# Patient Record
Sex: Female | Born: 1970 | Race: White | Hispanic: No | Marital: Married | Smoking: Never smoker
Health system: Southern US, Community
[De-identification: ages and names within clinical notes are randomized; demographics above are authoritative.]

## PROBLEM LIST (undated history)

## (undated) DIAGNOSIS — I10 Essential (primary) hypertension: Secondary | ICD-10-CM

## (undated) DIAGNOSIS — F3281 Premenstrual dysphoric disorder: Secondary | ICD-10-CM

## (undated) DIAGNOSIS — K219 Gastro-esophageal reflux disease without esophagitis: Secondary | ICD-10-CM

## (undated) DIAGNOSIS — D249 Benign neoplasm of unspecified breast: Secondary | ICD-10-CM

## (undated) DIAGNOSIS — G43909 Migraine, unspecified, not intractable, without status migrainosus: Secondary | ICD-10-CM

## (undated) DIAGNOSIS — K5792 Diverticulitis of intestine, part unspecified, without perforation or abscess without bleeding: Secondary | ICD-10-CM

## (undated) DIAGNOSIS — T7840XA Allergy, unspecified, initial encounter: Secondary | ICD-10-CM

## (undated) HISTORY — PX: COLONOSCOPY: SHX174

## (undated) HISTORY — PX: BREAST EXCISIONAL BIOPSY: SUR124

## (undated) HISTORY — PX: TONSILLECTOMY: SHX5217

## (undated) HISTORY — DX: Migraine, unspecified, not intractable, without status migrainosus: G43.909

## (undated) HISTORY — DX: Essential (primary) hypertension: I10

## (undated) HISTORY — DX: Diverticulitis of intestine, part unspecified, without perforation or abscess without bleeding: K57.92

## (undated) HISTORY — DX: Gastro-esophageal reflux disease without esophagitis: K21.9

## (undated) HISTORY — DX: Premenstrual dysphoric disorder: F32.81

## (undated) HISTORY — PX: CERVICAL FUSION: SHX112

## (undated) HISTORY — DX: Benign neoplasm of unspecified breast: D24.9

## (undated) HISTORY — DX: Allergy, unspecified, initial encounter: T78.40XA

---

## 1994-08-26 HISTORY — PX: OTHER SURGICAL HISTORY: SHX169

## 2002-10-01 ENCOUNTER — Other Ambulatory Visit: Admission: RE | Admit: 2002-10-01 | Discharge: 2002-10-01 | Payer: Self-pay | Admitting: Internal Medicine

## 2003-03-01 ENCOUNTER — Encounter: Payer: Self-pay | Admitting: Family Medicine

## 2003-03-01 ENCOUNTER — Ambulatory Visit (HOSPITAL_COMMUNITY): Admission: RE | Admit: 2003-03-01 | Discharge: 2003-03-01 | Payer: Self-pay | Admitting: Family Medicine

## 2003-03-15 ENCOUNTER — Encounter: Payer: Self-pay | Admitting: Internal Medicine

## 2003-03-15 ENCOUNTER — Encounter: Admission: RE | Admit: 2003-03-15 | Discharge: 2003-03-15 | Payer: Self-pay | Admitting: Family Medicine

## 2003-03-15 ENCOUNTER — Encounter: Payer: Self-pay | Admitting: Family Medicine

## 2003-03-31 ENCOUNTER — Encounter: Admission: RE | Admit: 2003-03-31 | Discharge: 2003-03-31 | Payer: Self-pay | Admitting: Internal Medicine

## 2003-03-31 ENCOUNTER — Encounter: Payer: Self-pay | Admitting: Unknown Physician Specialty

## 2003-12-21 ENCOUNTER — Other Ambulatory Visit: Admission: RE | Admit: 2003-12-21 | Discharge: 2003-12-21 | Payer: Self-pay | Admitting: Internal Medicine

## 2003-12-26 ENCOUNTER — Encounter: Admission: RE | Admit: 2003-12-26 | Discharge: 2003-12-26 | Payer: Self-pay | Admitting: Internal Medicine

## 2004-08-07 ENCOUNTER — Ambulatory Visit: Payer: Self-pay | Admitting: General Surgery

## 2004-11-26 ENCOUNTER — Ambulatory Visit: Payer: Self-pay | Admitting: Internal Medicine

## 2005-01-28 ENCOUNTER — Other Ambulatory Visit: Admission: RE | Admit: 2005-01-28 | Discharge: 2005-01-28 | Payer: Self-pay | Admitting: Internal Medicine

## 2005-01-28 ENCOUNTER — Ambulatory Visit: Payer: Self-pay | Admitting: Internal Medicine

## 2005-10-24 HISTORY — PX: BACK SURGERY: SHX140

## 2006-03-10 ENCOUNTER — Ambulatory Visit: Payer: Self-pay | Admitting: Family Medicine

## 2006-08-27 ENCOUNTER — Encounter: Admission: RE | Admit: 2006-08-27 | Discharge: 2006-08-27 | Payer: Self-pay | Admitting: General Surgery

## 2006-09-03 ENCOUNTER — Encounter: Admission: RE | Admit: 2006-09-03 | Discharge: 2006-09-03 | Payer: Self-pay | Admitting: General Surgery

## 2006-09-24 ENCOUNTER — Ambulatory Visit: Payer: Self-pay | Admitting: Internal Medicine

## 2006-10-07 ENCOUNTER — Encounter: Payer: Self-pay | Admitting: Internal Medicine

## 2006-12-17 ENCOUNTER — Encounter: Payer: Self-pay | Admitting: Internal Medicine

## 2006-12-17 ENCOUNTER — Other Ambulatory Visit: Admission: RE | Admit: 2006-12-17 | Discharge: 2006-12-17 | Payer: Self-pay | Admitting: Internal Medicine

## 2006-12-17 ENCOUNTER — Ambulatory Visit: Payer: Self-pay | Admitting: Internal Medicine

## 2006-12-17 DIAGNOSIS — R5381 Other malaise: Secondary | ICD-10-CM

## 2006-12-17 DIAGNOSIS — K644 Residual hemorrhoidal skin tags: Secondary | ICD-10-CM | POA: Insufficient documentation

## 2006-12-17 DIAGNOSIS — R5383 Other fatigue: Secondary | ICD-10-CM

## 2006-12-17 DIAGNOSIS — F39 Unspecified mood [affective] disorder: Secondary | ICD-10-CM | POA: Insufficient documentation

## 2006-12-17 DIAGNOSIS — K5730 Diverticulosis of large intestine without perforation or abscess without bleeding: Secondary | ICD-10-CM | POA: Insufficient documentation

## 2006-12-17 LAB — CONVERTED CEMR LAB
BUN: 14 mg/dL (ref 6–23)
Basophils Absolute: 0 10*3/uL (ref 0.0–0.1)
Basophils Relative: 0.3 % (ref 0.0–1.0)
Eosinophils Absolute: 0.1 10*3/uL (ref 0.0–0.6)
Eosinophils Relative: 0.9 % (ref 0.0–5.0)
GFR calc Af Amer: 104 mL/min
HCT: 39.4 % (ref 36.0–46.0)
Hemoglobin: 13.6 g/dL (ref 12.0–15.0)
Monocytes Absolute: 0.6 10*3/uL (ref 0.2–0.7)
Neutro Abs: 4.4 10*3/uL (ref 1.4–7.7)
Neutrophils Relative %: 65.6 % (ref 43.0–77.0)
RDW: 12.7 % (ref 11.5–14.6)
WBC: 6.7 10*3/uL (ref 4.5–10.5)

## 2007-03-16 ENCOUNTER — Encounter: Payer: Self-pay | Admitting: Internal Medicine

## 2007-08-31 ENCOUNTER — Encounter: Admission: RE | Admit: 2007-08-31 | Discharge: 2007-08-31 | Payer: Self-pay | Admitting: General Surgery

## 2007-09-29 ENCOUNTER — Encounter: Payer: Self-pay | Admitting: Internal Medicine

## 2007-12-09 ENCOUNTER — Encounter: Payer: Self-pay | Admitting: Internal Medicine

## 2007-12-31 ENCOUNTER — Telehealth (INDEPENDENT_AMBULATORY_CARE_PROVIDER_SITE_OTHER): Payer: Self-pay | Admitting: *Deleted

## 2008-01-13 ENCOUNTER — Ambulatory Visit: Payer: Self-pay | Admitting: Internal Medicine

## 2008-01-13 ENCOUNTER — Telehealth (INDEPENDENT_AMBULATORY_CARE_PROVIDER_SITE_OTHER): Payer: Self-pay | Admitting: *Deleted

## 2008-01-13 DIAGNOSIS — B079 Viral wart, unspecified: Secondary | ICD-10-CM | POA: Insufficient documentation

## 2008-01-28 ENCOUNTER — Ambulatory Visit: Payer: Self-pay | Admitting: Internal Medicine

## 2008-01-28 DIAGNOSIS — M545 Low back pain: Secondary | ICD-10-CM

## 2008-03-16 ENCOUNTER — Encounter: Admission: RE | Admit: 2008-03-16 | Discharge: 2008-03-16 | Payer: Self-pay | Admitting: Internal Medicine

## 2008-03-17 ENCOUNTER — Telehealth: Payer: Self-pay | Admitting: Internal Medicine

## 2008-04-11 ENCOUNTER — Encounter: Payer: Self-pay | Admitting: Internal Medicine

## 2008-04-21 ENCOUNTER — Ambulatory Visit: Payer: Self-pay | Admitting: Internal Medicine

## 2008-04-21 DIAGNOSIS — J309 Allergic rhinitis, unspecified: Secondary | ICD-10-CM | POA: Insufficient documentation

## 2008-08-20 ENCOUNTER — Encounter: Admission: RE | Admit: 2008-08-20 | Discharge: 2008-08-20 | Payer: Self-pay | Admitting: Obstetrics and Gynecology

## 2008-09-01 ENCOUNTER — Telehealth: Payer: Self-pay | Admitting: Family Medicine

## 2008-09-15 ENCOUNTER — Encounter: Payer: Self-pay | Admitting: Physical Medicine and Rehabilitation

## 2008-09-26 ENCOUNTER — Encounter: Payer: Self-pay | Admitting: Physical Medicine and Rehabilitation

## 2008-10-11 ENCOUNTER — Encounter: Payer: Self-pay | Admitting: Internal Medicine

## 2008-11-02 ENCOUNTER — Encounter: Admission: RE | Admit: 2008-11-02 | Discharge: 2008-11-02 | Payer: Self-pay | Admitting: General Surgery

## 2008-11-11 ENCOUNTER — Encounter: Admission: RE | Admit: 2008-11-11 | Discharge: 2008-11-11 | Payer: Self-pay | Admitting: General Surgery

## 2008-11-16 ENCOUNTER — Telehealth: Payer: Self-pay | Admitting: Internal Medicine

## 2008-12-05 ENCOUNTER — Ambulatory Visit: Payer: Self-pay | Admitting: Internal Medicine

## 2008-12-05 ENCOUNTER — Telehealth (INDEPENDENT_AMBULATORY_CARE_PROVIDER_SITE_OTHER): Payer: Self-pay | Admitting: *Deleted

## 2008-12-05 DIAGNOSIS — F329 Major depressive disorder, single episode, unspecified: Secondary | ICD-10-CM | POA: Insufficient documentation

## 2008-12-29 ENCOUNTER — Ambulatory Visit: Payer: Self-pay | Admitting: Internal Medicine

## 2009-01-24 ENCOUNTER — Ambulatory Visit: Payer: Self-pay | Admitting: Family Medicine

## 2009-01-24 DIAGNOSIS — S139XXA Sprain of joints and ligaments of unspecified parts of neck, initial encounter: Secondary | ICD-10-CM

## 2009-01-25 ENCOUNTER — Encounter: Payer: Self-pay | Admitting: Family Medicine

## 2009-01-25 ENCOUNTER — Telehealth (INDEPENDENT_AMBULATORY_CARE_PROVIDER_SITE_OTHER): Payer: Self-pay | Admitting: Internal Medicine

## 2009-01-26 ENCOUNTER — Encounter: Payer: Self-pay | Admitting: Internal Medicine

## 2009-01-30 ENCOUNTER — Telehealth: Payer: Self-pay | Admitting: Internal Medicine

## 2009-02-01 ENCOUNTER — Telehealth: Payer: Self-pay | Admitting: Internal Medicine

## 2009-02-01 DIAGNOSIS — M5412 Radiculopathy, cervical region: Secondary | ICD-10-CM | POA: Insufficient documentation

## 2009-02-04 ENCOUNTER — Encounter: Admission: RE | Admit: 2009-02-04 | Discharge: 2009-02-04 | Payer: Self-pay | Admitting: Internal Medicine

## 2009-02-09 ENCOUNTER — Telehealth: Payer: Self-pay | Admitting: Internal Medicine

## 2009-02-23 ENCOUNTER — Inpatient Hospital Stay (HOSPITAL_COMMUNITY): Admission: RE | Admit: 2009-02-23 | Discharge: 2009-02-24 | Payer: Self-pay | Admitting: Neurosurgery

## 2009-02-23 ENCOUNTER — Encounter: Payer: Self-pay | Admitting: Family Medicine

## 2009-03-06 ENCOUNTER — Telehealth: Payer: Self-pay | Admitting: Internal Medicine

## 2009-04-24 ENCOUNTER — Ambulatory Visit: Payer: Self-pay | Admitting: Internal Medicine

## 2009-04-24 ENCOUNTER — Other Ambulatory Visit: Admission: RE | Admit: 2009-04-24 | Discharge: 2009-04-24 | Payer: Self-pay | Admitting: Internal Medicine

## 2009-04-24 ENCOUNTER — Encounter: Payer: Self-pay | Admitting: Internal Medicine

## 2009-04-25 ENCOUNTER — Encounter: Payer: Self-pay | Admitting: Internal Medicine

## 2009-07-09 ENCOUNTER — Inpatient Hospital Stay (HOSPITAL_COMMUNITY): Admission: RE | Admit: 2009-07-09 | Discharge: 2009-07-11 | Payer: Self-pay | Admitting: Psychiatry

## 2009-07-10 ENCOUNTER — Ambulatory Visit: Payer: Self-pay | Admitting: Psychiatry

## 2009-10-20 ENCOUNTER — Telehealth: Payer: Self-pay | Admitting: Internal Medicine

## 2009-10-24 ENCOUNTER — Ambulatory Visit: Payer: Self-pay | Admitting: Internal Medicine

## 2009-11-03 ENCOUNTER — Encounter: Admission: RE | Admit: 2009-11-03 | Discharge: 2009-11-03 | Payer: Self-pay | Admitting: General Surgery

## 2009-11-14 ENCOUNTER — Telehealth (INDEPENDENT_AMBULATORY_CARE_PROVIDER_SITE_OTHER): Payer: Self-pay | Admitting: *Deleted

## 2009-12-12 ENCOUNTER — Encounter: Payer: Self-pay | Admitting: Internal Medicine

## 2009-12-12 LAB — HM MAMMOGRAPHY

## 2009-12-18 ENCOUNTER — Encounter: Payer: Self-pay | Admitting: Internal Medicine

## 2010-02-08 ENCOUNTER — Telehealth: Payer: Self-pay | Admitting: Internal Medicine

## 2010-05-01 ENCOUNTER — Ambulatory Visit: Payer: Self-pay | Admitting: Internal Medicine

## 2010-05-01 ENCOUNTER — Other Ambulatory Visit: Admission: RE | Admit: 2010-05-01 | Discharge: 2010-05-01 | Payer: Self-pay | Admitting: Internal Medicine

## 2010-05-01 LAB — HM PAP SMEAR

## 2010-05-02 LAB — CONVERTED CEMR LAB
BUN: 14 mg/dL (ref 6–23)
Basophils Relative: 0.3 % (ref 0.0–3.0)
Bilirubin, Direct: 0.1 mg/dL (ref 0.0–0.3)
CO2: 29 meq/L (ref 19–32)
Calcium: 8.6 mg/dL (ref 8.4–10.5)
Creatinine, Ser: 0.7 mg/dL (ref 0.4–1.2)
GFR calc non Af Amer: 96.39 mL/min (ref >60)
Lymphocytes Relative: 22.5 % (ref 12.0–46.0)
MCV: 85.4 fL (ref 78.0–100.0)
Monocytes Absolute: 0.5 10*3/uL (ref 0.1–1.0)
Monocytes Relative: 8 % (ref 3.0–12.0)
Neutro Abs: 3.9 10*3/uL (ref 1.4–7.7)
Neutrophils Relative %: 65.4 % (ref 43.0–77.0)
Platelets: 284 10*3/uL (ref 150.0–400.0)
Potassium: 4.2 meq/L (ref 3.5–5.1)
RBC: 4.16 M/uL (ref 3.87–5.11)
RDW: 13.7 % (ref 11.5–14.6)
Sodium: 140 meq/L (ref 135–145)

## 2010-05-24 ENCOUNTER — Telehealth: Payer: Self-pay | Admitting: Internal Medicine

## 2010-06-25 ENCOUNTER — Telehealth: Payer: Self-pay | Admitting: Internal Medicine

## 2010-07-02 ENCOUNTER — Ambulatory Visit: Payer: Self-pay | Admitting: Internal Medicine

## 2010-09-06 ENCOUNTER — Ambulatory Visit
Admission: RE | Admit: 2010-09-06 | Discharge: 2010-09-06 | Payer: Self-pay | Source: Home / Self Care | Attending: Internal Medicine | Admitting: Internal Medicine

## 2010-09-25 NOTE — Progress Notes (Signed)
Summary: SERTRALINE  Phone Note Refill Request Message from:  [phone] on October 20, 2009 8:06 AM  Refills Requested: Medication #1:  SERTRALINE HCL 100 MG TABS 1 daily as directed   Last Refilled: 09/17/2009 E-Scribe Request    Method Requested: Telephone to Pharmacy Initial call taken by: Mervin Hack CMA Duncan Dull),  October 20, 2009 8:07 AM  Follow-up for Phone Call        okay to refill for 1 year Follow-up by: Cindee Salt MD,  October 20, 2009 8:13 AM  Additional Follow-up for Phone Call Additional follow up Details #1::        Rx faxed to pharmacy Additional Follow-up by: DeShannon Smith CMA Duncan Dull),  October 20, 2009 8:47 AM    Prescriptions: SERTRALINE HCL 100 MG TABS (SERTRALINE HCL) 1 daily as directed  #30 Each x 12   Entered by:   Mervin Hack CMA (AAMA)   Authorized by:   Cindee Salt MD   Signed by:   Mervin Hack CMA (AAMA) on 10/20/2009   Method used:   Electronically to        [facility] [address]       [address]       [place], [place]  [address]       Ph: [phone]       Fax: [phone]   RxID:   [id number]

## 2010-09-25 NOTE — Progress Notes (Signed)
Summary: requests xanax  Phone Note Call from Patient Call back at [phone]   Caller: Patient Call For: Cindee Salt MD Summary of Call: Pt says she is having a problem with anxiety, she says that you are aware that she has panic attacks.  She is asking if she can have just a few xanax called to [facility] [address], suggested by her psychologist. Initial call taken by: Lowella Petties CMA, AAMA,  June 25, 2010 4:49 PM  Follow-up for Phone Call        okay alprazolam 0.25 #20 x 0 1 two times a day as needed for severe anxiety Follow-up by: Cindee Salt MD,  June 25, 2010 5:41 PM  Additional Follow-up for Phone Call Additional follow up Details #1::        Rx Called In, Called patient and left message on machine with results Additional Follow-up by: Mervin Hack CMA (AAMA),  June 26, 2010 9:00 AM    New/Updated Medications: ALPRAZOLAM 0.25 MG TABS (ALPRAZOLAM) 1 two times a day as needed for severe anxiety Prescriptions: ALPRAZOLAM 0.25 MG TABS (ALPRAZOLAM) 1 two times a day as needed for severe anxiety  #20 x 0   Entered by:   Mervin Hack CMA (AAMA)   Authorized by:   Cindee Salt MD   Signed by:   Mervin Hack CMA (AAMA) on 06/26/2010   Method used:   Telephoned to ...       [facility] [address]ail[facility]       [address], [address]       [place], [place]  [address]       Ph: [phone]       Fax: [phone]   RxID:   [id number]

## 2010-09-25 NOTE — Progress Notes (Signed)
Summary: ?? concerning how she feels  Phone Note Call from Patient Call back at Work Phone [phone] Call back at [phone]   Caller: Patient Call For: Tracy Salt MD Summary of Call: Patient has been having a really fast heart rate over the last week. She has been feeling dizzy, having palpitations, nauseated and feeling very fatigued. This morning her BP was a little elevated at 140/86.  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 said it was okay, but that she should let you know what has been going on. She wants to know if she need to come in and be seen or if she should just monitor this.Please advise.  Initial call taken by: Melody Comas,  February 08, 2010 10:27 AM  Follow-up for Phone Call        please make sure she isn't having a lot of caffeine or taking any cold meds  If symptoms persist, she should make appt for evaluation Follow-up by: Tracy Salt MD,  February 08, 2010 12:46 PM  Additional Follow-up for Phone Call Additional follow up Details #1::        Patient says that she has not been having alot of caffeine or cold med. She will call back and make appt. if symptoms persist.  Additional Follow-up by: Melody Comas,  February 08, 2010 1:15 PM

## 2010-09-25 NOTE — Letter (Signed)
Summary: [facility]  [facility]   Imported By: Maryln Gottron 12/21/2009 14:56:41  _____________________________________________________________________  External Attachment:    Type:   Image     Comment:   External Document

## 2010-09-25 NOTE — Progress Notes (Signed)
Summary: Diag Code  Phone Note Call from Patient Call back at [phone]   Caller: Patient Call For: Cindee Salt MD Summary of Call: pt was in for office visit on 10/24/2009 and pt states she would like the diag. codes changed from depression to something else, less cheaper. Pt states her insurance will not pay for diag. of depression, she knows this can be done. Please advise. Initial call taken by: Mervin Hack CMA Duncan Dull),  November 14, 2009 10:12 AM  Follow-up for Phone Call        Please let her know that that is the only diagnosis code used at that visit so I cannot change it. I am sorry about the increased charge and we could bill it under a different code if there were any others documented. That should not be an issue for her physical Follow-up by: Cindee Salt MD,  November 14, 2009 10:18 AM    Additional Follow-up for Phone Call Additional follow up Details #2::    I explained coding and billing is based on documentation and that the documentation supports the billing.  I also explained that we are not able to change documentation/billing based on patient or insurance requests and that documentation had been reviewed and supports coding/billing.    Patient stated that she may not be able to come to the office visit for follow-up for this particular medication due to the cost of the visit.  She stated that she was only coming in to be monitored for the medicine and that she might not be able to come in as often or at all at this cost.    I explained that I would notify nurse/doctor and if there were any clinical information re: follow up with the medicine that they would be in contact.     Follow-up by: Clarisa Schools,  December 01, 2009 9:58 AM   Appended Document: Diag Code let her know that I will see her next for the physical which is covered--then discuss how we can manage this in the future

## 2010-09-25 NOTE — Assessment & Plan Note (Signed)
Summary: CPX /W/PAP/RBH   Vital Signs:  Patient profile:   40 year old female Weight:      155 pounds Temp:     98.5 degrees F oral Pulse rate:   68 / minute Pulse rhythm:   regular BP sitting:   128 / 80  (left arm) Cuff size:   regular  Vitals Entered By: Mervin Hack CMA Duncan Dull) (May 01, 2010 8:39 AM) CC: ADULT PHYSICAL   History of Present Illness: Had spell of palpitations and increased  BP in June Settled down quickly no recurrence  Has noticed that she is getting stangled a lot-even on saliva never chokes on food--just liquid No heartburn No voice changes  Bulging disc in low back is worse--gets sciatica at times Neck is fine  trouble with hands going numb at night has to keep arm on a pillow for arm she is sleeping on both are affecting Occ gets daytime tingling when gripping handlebars hard in spin class  Mood has been better still married but not back to normal still No infidelity that she is aware of  Allergies: 1)  Codeine Sulfate (Codeine Sulfate)  Past History:  Past medical, surgical, family and social histories (including risk factors) reviewed for relevance to current acute and chronic problems.  Past Medical History: Reviewed history from 04/21/2008 and no changes required. Diverticulosis, colon Migraines (with periods) Premenstrual dysphoric disorder Allergic rhinitis  Past Surgical History: Reviewed history from 04/24/2009 and no changes required. 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 Eyes:  Denies double vision and vision loss-1 eye. ENT:  Denies decreased hearing and ringing in ears; teeth okay--regular with dentist. CV:  Complains of lightheadness; denies chest pain or discomfort, difficulty breathing at night, difficulty breathing while lying down, fainting, palpitations, and shortness of breath with exertion; occ dizziness--may be related to missing meals. Resp:  Denies cough and shortness of breath. GI:  Denies abdominal pain, bloody stools, change in bowel habits, dark tarry stools, indigestion, nausea, and vomiting. GU:  Denies dysuria, incontinence, and urinary frequency; occ bladder pain if she gets urgency--spasms no sexual problems--he doesn't have interest. MS:  Complains of low back pain; denies joint pain and joint swelling. Derm:  Denies lesion(s) and rash. Neuro:  Complains of headaches, numbness, and tingling; denies tremors and weakness; migraines still--able to abort. Psych:  Complains of anxiety; occ anxiety spells--not bad occ down times but nothing persistent. Heme:  Denies abnormal bruising and enlarge lymph nodes. Allergy:  Complains of seasonal allergies and sneezing; uses OTC meds.  Physical Exam  General:  alert and normal appearance.   Eyes:  pupils equal, pupils round, pupils reactive to light, and no optic disk abnormalities.   Ears:  R ear normal and L ear normal.   Mouth:  no erythema, no exudates, and no lesions.   Palate moves normally Neck:  supple, no masses, no thyromegaly, no carotid bruits,  and no cervical lymphadenopathy.   Lungs:  normal respiratory effort, no intercostal retractions, no accessory muscle use, and normal breath sounds.   Heart:  normal rate, regular rhythm, no murmur, no gallop, and no rub.   Abdomen:  soft, non-tender, and no  masses.   Genitalia:  no external lesions, no vaginal or cervical lesions, no friaility or hemorrhage, normal uterus size and position, and no adnexal masses or tenderness.   Msk:  no joint tenderness and no joint swelling.   Pulses:  1+ in feet Extremities:  no edema Neurologic:  alert & oriented X3, strength normal in all extremities, and gait normal.   Skin:  no rashes and no suspicious lesions.   Multiple benign nevi Axillary Nodes:  No palpable lymphadenopathy Psych:  normally interactive, good eye contact, not anxious appearing, and not depressed appearing.     Impression & Recommendations:  Problem # 1:  PREVENTIVE HEALTH CARE (ICD-V70.0) Assessment Comment Only  healthy getting back to the gym pap done will check HIV--counselling done  Orders: Specimen Handling ([address]) T-HIV Antibody  (Reflex) ([id number])  Problem # 2:  DEPRESSION, MAJOR (ICD-296.20) Assessment: Improved  mood is better now still working through issues with marriage will continue her sertraline  Orders: TLB-Renal Function Panel (80069-RENAL) TLB-CBC Platelet - w/Differential (85025-CBCD) TLB-Hepatic/Liver Function Pnl (80076-HEPATIC) TLB-TSH (Thyroid Stimulating Hormone) (84443-TSH) Venipuncture (91478)  Problem # 3:  ALLERGIC RHINITIS (ICD-477.9) Assessment: Unchanged okay with OTC meds  Her updated medication list for this problem includes:    Zyrtec Allergy 10 Mg Tabs (Cetirizine hcl) .Marland Kitchen... Take one by mouth once daily  Complete Medication List: 1)  Fioricet 50-325-40 Mg Tabs (Butalbital-apap-caffeine) .... Take one by mouth at onset of headache 2)  Sertraline Hcl 100 Mg Tabs (Sertraline hcl) .Marland Kitchen.. 1 daily as directed 3)  Multivitamins Tabs (Multiple vitamin) .... One daily 4)  Eq Ibuprofen 200 Mg Tabs (Ibuprofen) .... Three tablets every six hours as needed. 5)  Zyrtec Allergy 10 Mg Tabs (Cetirizine hcl) .... Take one by mouth once daily  Patient Instructions: 1)  Please schedule a  follow-up appointment in 6 months .  Prescriptions: SERTRALINE HCL 100 MG TABS (SERTRALINE HCL) 1 daily as directed  #90 x 3   Entered and Authorized by:   Cindee Salt MD   Signed by:   Cindee Salt MD on 05/01/2010   Method used:   Print then Give to Patient   RxID:   [id number]   Current Allergies (reviewed today): CODEINE SULFATE (CODEINE SULFATE)   Mammogram  Procedure date:  12/12/2009  Findings:      No specific mammographic evidence of malignancy.  Assessment: BIRADS 2. Dr Evette Cristal

## 2010-09-25 NOTE — Assessment & Plan Note (Signed)
Summary: F/U MEDICATION/CLE   Vital Signs:  Patient profile:   40 year old female Weight:      147 pounds Temp:     98.5 degrees F oral Pulse rate:   60 / minute Pulse rhythm:   regular BP sitting:   120 / 90  (left arm) Cuff size:   regular  Vitals Entered By: Mervin Hack CMA Duncan Dull) (July 02, 2010 12:17 PM) CC: follow-up visit for medications   History of Present Illness: Seperated from her husband  He left in September--noone else but "he just doesn't know what he wants" Still going to counselling together---she has seen the counsellor alone a couple of times May help together some but she doesn't see it all that helpful for herself The kids are with her he is still helping financailly  Did increase the sertraline about 1 month ago seems to be helping  happy with her job Hasn't missed her work at all 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  Past Medical History: Reviewed history from 04/21/2008 and no changes required. Diverticulosis, colon Migraines (with periods) Premenstrual dysphoric disorder Allergic rhinitis  Past Surgical History: Reviewed history from 04/24/2009 and no changes required. 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 Colon cancer in grandparents on both sides and an uncle CAD in  Dad and pat GM No DM HTN in Dad 2 sisters--1 died of aplastic anemia (had hepatitis)  Social History: Reviewed history from 05/01/2010 and no changes required. 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   Psych:  normally interactive, good eye contact, not anxious appearing, and dysphoric affect.     Impression & Recommendations:  Problem # 1:  DEPRESSION, MAJOR (ICD-296.20) Assessment Comment Only ongoing issues all related to her estrangement that is her husband's choice she is patient for now but may move back to [place] area where her family is if he doesn't come around  some symptom control with meds--will continue  Complete Medication List: 1)  Alprazolam 0.25 Mg Tabs (Alprazolam) .Marland Kitchen.. 1 two times a day as needed for severe anxiety 2)  Fioricet 50-325-40 Mg Tabs (Butalbital-apap-caffeine) .... Take one by mouth at onset of headache 3)  Sertraline Hcl 100 Mg Tabs (Sertraline hcl) .Marland Kitchen.. 1 and 1/2 tabs  daily for depression 4)  Multivitamins Tabs (Multiple vitamin) .... One daily 5)  Eq Ibuprofen 200 Mg Tabs (Ibuprofen) .... Three tablets every six hours as needed. 6)  Zyrtec Allergy 10 Mg Tabs (Cetirizine hcl) .... Take one by mouth once daily  Patient Instructions: 1)  Please schedule a follow-up appointment in 2 months.  Prescriptions: SERTRALINE HCL 100 MG TABS (SERTRALINE HCL) 1 and 1/2 tabs  daily for depression  #45 x 11   Entered and Authorized by:   Cindee Salt MD   Signed by:   Cindee Salt MD on 07/02/2010   Method used:   Electronically to        [facility]  [address])       [address], [address]       [place], [place]  [address]       Ph: [phone]       Fax: [phone]   RxID:   [id number]    Orders Added: 1)  Est. Patient Level III [84132]    Current Allergies (reviewed today): CODEINE SULFATE (CODEINE SULFATE)

## 2010-09-25 NOTE — Miscellaneous (Signed)
   Clinical Lists Changes  Observations: Added new observation of MAMMOGRAM: Assessment: BIRADS 2.--no change in left breast nodule Had office eval by Dr Evette Cristal (12/12/2009 17:31)      Mammogram  Procedure date:  12/12/2009  Findings:      Assessment: BIRADS 2.--no change in left breast nodule Had office eval by Dr Evette Cristal

## 2010-09-25 NOTE — Assessment & Plan Note (Signed)
Summary: 4 M F/U DLO   Vital Signs:  Patient profile:   40 year old female Weight:      156 pounds BMI:     26.87 Temp:     98.3 degrees F oral Pulse rate:   72 / minute Pulse rhythm:   regular BP sitting:   118 / 80  (left arm) Cuff size:   regular  Vitals Entered By: Mervin Hack CMA Duncan Dull) (October 24, 2009 12:30 PM) CC: 6 month follow-up   History of Present Illness: Doing okay Neck is fine---surgery has been successful Regular work schedule  marriage is still okay No new problems Still with lots of stress--his company is getting sold Not sure of what that means  Doesn't feel she is ready to come off the medicine Was admitted in November for depression Crisis at that time--admitted for 1 day Husband felt he needed some time away---she "shut down" Suicidal thoughts but no plan or true intent--this prompted the 1+ day admission Things seemed better after that  Now seeing counsellor at [facility] counselling center In addition to marriage counsellor  Allergies: 1)  Codeine Sulfate (Codeine Sulfate)  Past History:  Past medical, surgical, family and social histories (including risk factors) reviewed for relevance to current acute and chronic problems.  Past Medical History: Reviewed history from 04/21/2008 and no changes required. Diverticulosis, colon Migraines (with periods) Premenstrual dysphoric disorder Allergic rhinitis  Past Surgical History: Reviewed history from 04/24/2009 and no changes required. 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 Colon cancer in grandparents on both sides and an uncle CAD in Dad and pat GM No DM HTN in Dad 2 sisters--1 died of aplastic anemia (had hepatitis)  Social History: Reviewed history from 12/05/2008 and no changes required. 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   Psych:  normally interactive, good eye contact, not anxious appearing, and not depressed appearing.     Impression & Recommendations:  Problem # 1:  DEPRESSION, MAJOR (ICD-296.20) Assessment Comment Only seems to be okay did have major breakdown in November when she felt her marriage was challenged--better now continues with counselling willl continue the sertaline for at least another year  Complete Medication List: 1)  Zyrtec Allergy 10 Mg Tabs (Cetirizine hcl) .... Take one by mouth once daily 2)  Fioricet 50-325-40 Mg Tabs (Butalbital-apap-caffeine) .... Take one by mouth at onset of headache 3)  Sertraline Hcl 100 Mg Tabs (Sertraline hcl) .Marland Kitchen.. 1 daily as directed 4)  Multivitamins Tabs (Multiple vitamin) .... One daily 5)  Eq Ibuprofen 200 Mg Tabs (Ibuprofen) .... Three tablets every six hours as needed.  Patient Instructions: 1)  Please schedule a follow-up appointment in 6 months for physical  Current Allergies (reviewed today): CODEINE SULFATE (CODEINE SULFATE)

## 2010-09-25 NOTE — Progress Notes (Signed)
Summary: wants to increase zoloft dose  Phone Note Call from Patient Call back at [phone]   Caller: Patient Call For: Cindee Salt MD Summary of Call: Pt has been on zoloft 100 mg's for over a year, and her depression has recently worsened due to her seperation.  She is asking if she can increase her dose temporarily.  Initial call taken by: Lowella Petties CMA,  May 24, 2010 10:17 AM  Follow-up for Phone Call        Sorry to hear she did seperate  Okay to increase sertraline to 150mg  daily (send new Rx #45 x 5) Please have her set up appt in about 2-3 weeks to see how she is on the higher dose. Offer an appt next week if she thinks she needs to be seen sooner Follow-up by: Cindee Salt MD,  May 24, 2010 1:04 PM  Additional Follow-up for Phone Call Additional follow up Details #1::        Rx Called In, Spoke with patient and advised results.  Additional Follow-up by: Mervin Hack CMA Duncan Dull),  May 24, 2010 5:25 PM    Prescriptions: SERTRALINE HCL 100 MG TABS (SERTRALINE HCL) 1 daily as directed  #45 x 3   Entered by:   Mervin Hack CMA (AAMA)   Authorized by:   Cindee Salt MD   Signed by:   Mervin Hack CMA (AAMA) on 05/24/2010   Method used:   Electronically to        [facility] [address]       [address]       [place], [place]  [address]       Ph: [phone]       Fax: [phone]   RxID:   [id number]

## 2010-09-27 NOTE — Assessment & Plan Note (Signed)
Summary: 2 MONTH FOLLOW UP/[facility]   Vital Signs:  Patient profile:   40 year old female Weight:      146 pounds BMI:     25.15 Temp:     99.0 degrees F oral Pulse rate:   66 / minute Pulse rhythm:   regular BP sitting:   134 / 82  (left arm) Cuff size:   regular  Vitals Entered By: Mervin Hack CMA Duncan Dull) (September 06, 2010 4:32 PM) CC: 2 month follow-up   History of Present Illness: Has been enjoying working as Therapist, music  No changes with husband Still living on his own in apt He sees the kids regularly Still hasn't legally seperated  Plans to move to [facility] in summer to be near family may be in [place] so only about 1 hour away oldest son may stay here with Dad and attend [employer]  Feels her mood is much better now since the holidays are over has gone back down on the sertraline dose still exercises daily  Allergies: 1)  Codeine Sulfate (Codeine Sulfate)  Past History:  Past medical, surgical, family and social histories (including risk factors) reviewed for relevance to current acute and chronic problems.  Past Medical History: Reviewed history from 04/21/2008 and no changes required. Diverticulosis, colon Migraines (with periods) Premenstrual dysphoric disorder Allergic rhinitis  Past Surgical History: Reviewed history from 04/24/2009 and no changes required. 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 Colon cancer in grandparents on both sides and an uncle CAD in Dad and pat GM No DM HTN in Dad 2 sisters--1 died of aplastic anemia (had hepatitis)  Social History: Reviewed history from 05/01/2010 and no changes required. 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   Psych:  normally interactive, good eye contact, not anxious appearing, and not depressed appearing.     Impression & Recommendations:  Problem # 1:  DEPRESSION, MAJOR (ICD-296.20) Assessment Improved doing better now since the holidays over doing okay on current meds will continue planning to move in summer  Complete Medication List: 1)  Alprazolam 0.25 Mg Tabs (Alprazolam) .Marland Kitchen.. 1 two times a day as needed for severe anxiety 2)  Fioricet 50-325-40 Mg Tabs (Butalbital-apap-caffeine) .... Take one by mouth at onset of headache 3)  Sertraline Hcl 100 Mg Tabs (Sertraline hcl) .Marland Kitchen.. 1 daily for depression 4)  Multivitamins Tabs (Multiple vitamin) .... One daily 5)  Eq Ibuprofen 200 Mg Tabs (Ibuprofen) .... Three tablets every six hours as needed. 6)  Zyrtec Allergy 10 Mg Tabs (Cetirizine hcl) .... Take one by mouth once daily  Patient Instructions: 1)  Please schedule a follow-up appointment in 4-5  months .    Orders Added: 1)  Est. Patient Level III [98119]    Current Allergies (reviewed today): CODEINE SULFATE (CODEINE SULFATE)

## 2010-10-03 ENCOUNTER — Other Ambulatory Visit: Payer: Self-pay | Admitting: Internal Medicine

## 2010-10-03 DIAGNOSIS — Z1231 Encounter for screening mammogram for malignant neoplasm of breast: Secondary | ICD-10-CM

## 2010-10-14 ENCOUNTER — Emergency Department: Payer: Self-pay | Admitting: Emergency Medicine

## 2010-11-06 ENCOUNTER — Ambulatory Visit
Admission: RE | Admit: 2010-11-06 | Discharge: 2010-11-06 | Disposition: A | Discharge status: Home / Self Care | Payer: 59 | Source: Ambulatory Visit | Attending: Internal Medicine | Admitting: Internal Medicine

## 2010-11-06 DIAGNOSIS — Z1231 Encounter for screening mammogram for malignant neoplasm of breast: Secondary | ICD-10-CM

## 2010-11-08 ENCOUNTER — Other Ambulatory Visit: Payer: Self-pay | Admitting: Internal Medicine

## 2010-11-08 DIAGNOSIS — R928 Other abnormal and inconclusive findings on diagnostic imaging of breast: Secondary | ICD-10-CM

## 2010-11-15 ENCOUNTER — Ambulatory Visit
Admission: RE | Admit: 2010-11-15 | Discharge: 2010-11-15 | Disposition: A | Discharge status: Home / Self Care | Payer: 59 | Source: Ambulatory Visit | Attending: Internal Medicine | Admitting: Internal Medicine

## 2010-11-15 ENCOUNTER — Other Ambulatory Visit: Payer: Self-pay | Admitting: Internal Medicine

## 2010-11-15 DIAGNOSIS — R928 Other abnormal and inconclusive findings on diagnostic imaging of breast: Secondary | ICD-10-CM

## 2010-11-22 ENCOUNTER — Other Ambulatory Visit: Payer: Self-pay | Admitting: Internal Medicine

## 2010-11-22 ENCOUNTER — Other Ambulatory Visit: Payer: Self-pay | Admitting: Diagnostic Radiology

## 2010-11-22 ENCOUNTER — Ambulatory Visit
Admission: RE | Admit: 2010-11-22 | Discharge: 2010-11-22 | Disposition: A | Discharge status: Home / Self Care | Payer: 59 | Source: Ambulatory Visit | Attending: Internal Medicine | Admitting: Internal Medicine

## 2010-11-22 DIAGNOSIS — R928 Other abnormal and inconclusive findings on diagnostic imaging of breast: Secondary | ICD-10-CM

## 2010-11-22 DIAGNOSIS — N6320 Unspecified lump in the left breast, unspecified quadrant: Secondary | ICD-10-CM

## 2010-11-28 LAB — CBC
HCT: 43.1 % (ref 36.0–46.0)
Hemoglobin: 14.9 g/dL (ref 12.0–15.0)
MCV: 84 fL (ref 78.0–100.0)
Platelets: 340 10*3/uL (ref 150–400)
RDW: 13.3 % (ref 11.5–15.5)
WBC: 8.3 10*3/uL (ref 4.0–10.5)

## 2010-11-28 LAB — COMPREHENSIVE METABOLIC PANEL
Alkaline Phosphatase: 97 U/L (ref 39–117)
BUN: 16 mg/dL (ref 6–23)
Chloride: 105 mEq/L (ref 96–112)
Creatinine, Ser: 0.83 mg/dL (ref 0.4–1.2)
Glucose, Bld: 122 mg/dL — ABNORMAL HIGH (ref 70–99)
Potassium: 3.6 mEq/L (ref 3.5–5.1)
Total Bilirubin: 0.6 mg/dL (ref 0.3–1.2)

## 2010-11-28 LAB — URINALYSIS, MICROSCOPIC ONLY
Leukocytes, UA: NEGATIVE
Nitrite: NEGATIVE
Specific Gravity, Urine: 1.026 (ref 1.005–1.030)
Urobilinogen, UA: 0.2 mg/dL (ref 0.0–1.0)
pH: 5.5 (ref 5.0–8.0)

## 2010-11-28 LAB — DRUGS OF ABUSE SCREEN W/O ALC, ROUTINE URINE
Barbiturate Quant, Ur: POSITIVE — AB
Creatinine,U: 247.8 mg/dL
Marijuana Metabolite: NEGATIVE
Opiate Screen, Urine: NEGATIVE
Propoxyphene: NEGATIVE

## 2010-11-28 LAB — ETHANOL: Alcohol, Ethyl (B): <5 mg/dL (ref 0–10)

## 2010-11-28 LAB — TSH: TSH: 2.642 u[IU]/mL (ref 0.350–4.500)

## 2010-12-03 LAB — CBC
Platelets: 270 10*3/uL (ref 150–400)
RBC: 4.2 MIL/uL (ref 3.87–5.11)
WBC: 5.8 10*3/uL (ref 4.0–10.5)

## 2011-01-08 NOTE — Op Note (Signed)
[name], [name]                 ACCOUNT NO.:  [id number]   MEDICAL RECORD NO.:  [id number]          PATIENT TYPE:  INP   LOCATION:  3008                         FACILITY:  [facility]   PHYSICIAN:  Reinaldo Meeker, M.D. DATE OF BIRTH:  01/20/1971   DATE OF PROCEDURE:  02/23/2009  DATE OF DISCHARGE:                               OPERATIVE REPORT   PREOPERATIVE DIAGNOSIS:  Herniated disk, C6-7, left.   POSTOPERATIVE DIAGNOSIS:  Herniated disk, C6-7, left.   PROCEDURE:  C6-7 anterior cervical diskectomy with bone bank fusion  followed by Mystique anterior cervical plating.   SURGEON:  Reinaldo Meeker, MD   ASSISTANT:  Tia Alert, MD   PROCEDURE IN DETAIL:  After being placed in a supine position and 5-  pound Halter traction, the patient's neck was prepped and draped in the  usual sterile fashion.  Localizing fluoroscopy was used prior to  incision to identify the appropriate level.  Transverse incision was  then made in the right anterior neck, starting at the midline headed  towards the medial aspect of the sternocleidomastoid muscle.  The  platysma muscle was then incised transversely in the natural fascial  plane between the strap muscles medially and the sternocleidomastoid  laterally was identified and followed down to the anterior aspect of  cervical spine.  Longus colli muscles were identified, split in the  midline, stripped away bilaterally with Key dissector and unipolar  coagulation.  Self-retaining retractor was placed for exposure and x-  rays showed approach at the appropriate level.  Using a 15-blade, the  anterior disk  at C6-7 was incised.  Using pituitary rongeurs and  curettes approximately 9 cm of disk material was removed.  High-speed  drill was used to widen the interspace.  Microscope was draped, brought  in the field, and used for the remainder of the case.  Using  microsection technique, the remainder of disk material of the posterior  longitudinal  ligament was removed.  The ligament was then incised  transversely and the cut edges removed with Kerrison punch.  Inspection  of the left C6-7 foramen yielded a large amount of herniated disk  material, as this was removed, it gave the better exposure and better  decompression of the C7 nerve root.  At this time, inspection was  carried out in all directions for any evidence of residual compression  and none could be identified.  Large amounts of irrigation were carried  out.  Any bleeding controlled with bipolar coagulation and Gelfoam.  Measurements were then taken after we confirmed that the spinal dura and  C7 nerve roots were well decompressed bilaterally.  A 7-mm bone bank  plugs reconstituted.  After irrigating once more, confirming hemostasis,  plugs impacted without difficulty and fluoroscopy showed to be in good  position.  An appropriate length Mystique anterior cervical plate was  then chosen.  Under fluoroscopic guidance, drill holes were placed  followed by tapping and re-tapping and placing of 13-mm screws x4.  Locking mechanism was secured at both levels bilaterally and final  fluoroscopy showed the plate, screws, and  plug to be in good position.  Large amounts of irrigation were carried out at this time and any  bleeding controlled with bipolar  coagulation and Gelfoam.  Wound was then closed with interrupted Vicryl  on the platysma muscle, inverted 5-0 PDS on the subcuticular layer, and  Steri-Strips on the skin.  A sterile dressing and soft collar applied.  The patient was extubated, taken to recovery room in stable condition.           ______________________________  Reinaldo Meeker, M.D.     ROK/MEDQ  D:  02/23/2009  T:  02/24/2009  Job:  [id number]

## 2011-02-18 ENCOUNTER — Encounter: Payer: Self-pay | Admitting: Internal Medicine

## 2011-02-19 ENCOUNTER — Ambulatory Visit (INDEPENDENT_AMBULATORY_CARE_PROVIDER_SITE_OTHER): Payer: 59 | Admitting: Internal Medicine

## 2011-02-19 ENCOUNTER — Encounter: Payer: Self-pay | Admitting: Internal Medicine

## 2011-02-19 DIAGNOSIS — F329 Major depressive disorder, single episode, unspecified: Secondary | ICD-10-CM

## 2011-02-19 NOTE — Assessment & Plan Note (Signed)
In remission Will continue med Consider weaning at next visit in 6 months

## 2011-02-19 NOTE — Progress Notes (Signed)
  Subjective:    Patient ID: [name], female    DOB: 06-Dec-1970, 40 y.o.   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 ALPRAZolam (XANAX) 0.25 MG tablet Take 0.25 mg by mouth 2 (two) times daily as needed.        . butalbital-acetaminophen-caffeine (FIORICET WITH CODEINE) 50-325-40-30 MG per capsule Take 1 capsule by mouth every 4 (four) hours as needed.        . sertraline (ZOLOFT) 100 MG tablet Take 100 mg by mouth daily.          Allergies  Allergen Reactions  . Codeine Sulfate     REACTION: Nausea    Past Medical History  Diagnosis Date  . Diverticulitis   . Migraines   . Premenstrual dysphoric disorder   . Allergy   . Breast tumor     left breast  . Breast fibroadenoma     Past Surgical History  Procedure Date  . Tonsillectomy   . Cervical fusion     C6-7    Family History  Problem Relation Age of Onset  . Coronary artery disease Father   . Hypertension Father   . Colon cancer Maternal Uncle   . Colon cancer Maternal Grandmother   . Colon cancer Maternal Grandfather   . Colon cancer Paternal Grandmother   . Coronary artery disease Paternal Grandmother   . Colon cancer Paternal Grandfather     History   Social History  . Marital Status: Married    Spouse Name: N/A    Number of Children: 2  . Years of Education: N/A   Occupational History  . 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 Now starting as hospice nurse for [employer]    Social History Main Topics  . Smoking status: Never Smoker   . Smokeless tobacco: Never Used  . Alcohol Use: No  . Drug Use: Not on file  . Sexually Active: Not on file   Other Topics Concern  . Not on file   Social History Narrative  . No narrative on file   Review of Systems Eating well No sleep problems     Objective:   Physical Exam  Constitutional: She appears well-developed and well-nourished. No distress.  Lymphadenopathy:    She has no axillary adenopathy.  Psychiatric: She has a normal mood and affect. Her behavior is normal. Judgment and thought content normal.          Assessment & Plan:

## 2011-04-14 ENCOUNTER — Other Ambulatory Visit: Payer: Self-pay | Admitting: Internal Medicine

## 2011-04-15 NOTE — Telephone Encounter (Signed)
Rx sent electronically.  

## 2011-04-15 NOTE — Telephone Encounter (Signed)
Received refill request electronically from pharmacy. Is it okay to refill medication? Please verify dispense amount and number of refills. 

## 2011-05-14 ENCOUNTER — Other Ambulatory Visit: Payer: Self-pay | Admitting: *Deleted

## 2011-05-14 ENCOUNTER — Ambulatory Visit (INDEPENDENT_AMBULATORY_CARE_PROVIDER_SITE_OTHER): Payer: No Typology Code available for payment source | Admitting: Internal Medicine

## 2011-05-14 DIAGNOSIS — Z0000 Encounter for general adult medical examination without abnormal findings: Secondary | ICD-10-CM

## 2011-05-14 MED ORDER — METRONIDAZOLE 0.75 % VA GEL: VAGINAL | Status: DC

## 2011-05-14 MED ORDER — BUTALBITAL-APAP-CAFF-COD 50-325-40-30 MG PO CAPS: 1.0000 | ORAL_CAPSULE | ORAL | Status: DC | PRN

## 2011-05-14 NOTE — Telephone Encounter (Signed)
Phoned request from patient.  Uses [facility] [address].

## 2011-05-14 NOTE — Telephone Encounter (Signed)
rx was printed by mistake, rx called into [facility] [address].

## 2011-05-14 NOTE — Telephone Encounter (Signed)
Pt was seen this morning and was told a script would be sent to pharmacy for her itching.  Advised her the computers just came back up and we would get this sent in.  Per Dr Alphonsus Sias, metrogel vaginal.  Insert applicator full in vagina twice a day x 5 days.  One tube with one refill sent to [facility] [address].

## 2011-05-14 NOTE — Telephone Encounter (Signed)
Okay #30 x 0 

## 2011-05-15 DIAGNOSIS — Z0000 Encounter for general adult medical examination without abnormal findings: Secondary | ICD-10-CM | POA: Insufficient documentation

## 2011-05-15 NOTE — Progress Notes (Signed)
  Subjective:    Patient ID: [name], female    DOB: 1970-08-31, 40 y.o.   MRN: [id number]  HPI See scanned note   Review of Systems     Objective:   Physical Exam        Assessment & Plan:

## 2011-05-16 ENCOUNTER — Telehealth: Payer: Self-pay | Admitting: *Deleted

## 2011-05-16 MED ORDER — BUTALBITAL-APAP-CAFFEINE 50-325-40 MG PO TABS: 1.0000 | ORAL_TABLET | ORAL | Status: DC | PRN

## 2011-05-16 NOTE — Telephone Encounter (Signed)
Please correct this Okay #30 x 0 of plain fioricet

## 2011-05-16 NOTE — Telephone Encounter (Signed)
Pt states the wrong fioricet was sent to pharmacy yesterday, we sent it with codeine, pt cant take codeine.  She is asking that we call the correct one to [facility] [address].

## 2011-05-16 NOTE — Telephone Encounter (Signed)
Correct rx sent to pharmacy.

## 2011-06-08 IMAGING — US US OUTSIDE FILMS BREAST
1 series · 5 of 5 positions shown · non-contrast
Comparison: none

[Series 1: us outside films breast · 5 of 5 slices shown]
[im 1/5]
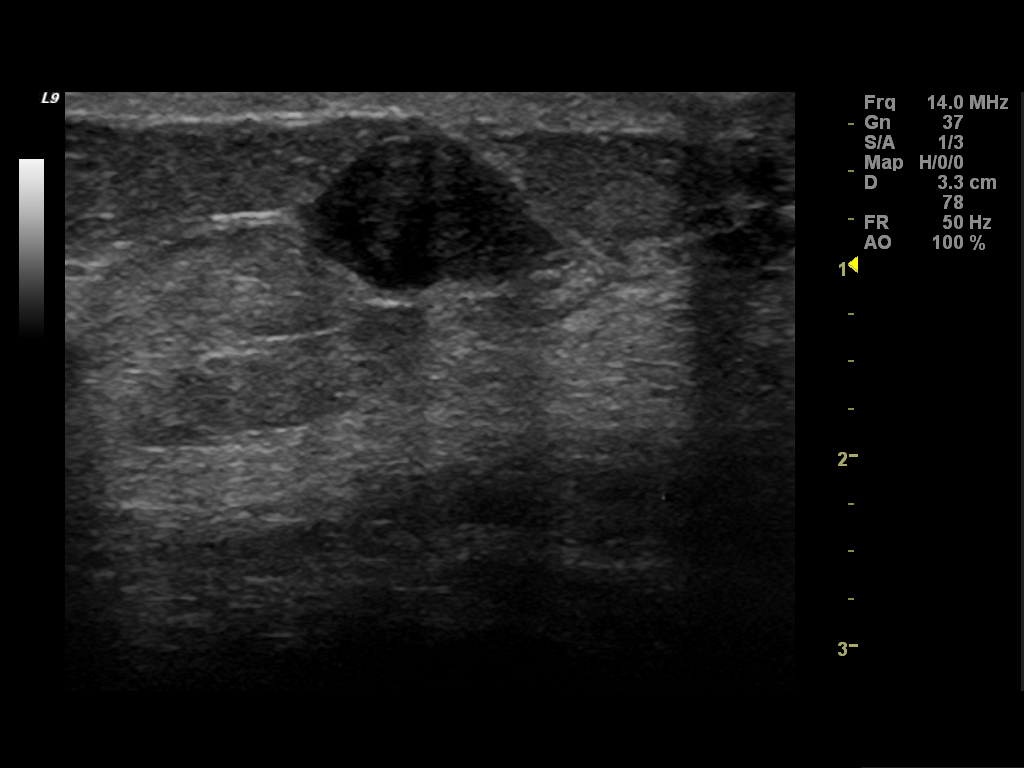
[im 2/5]
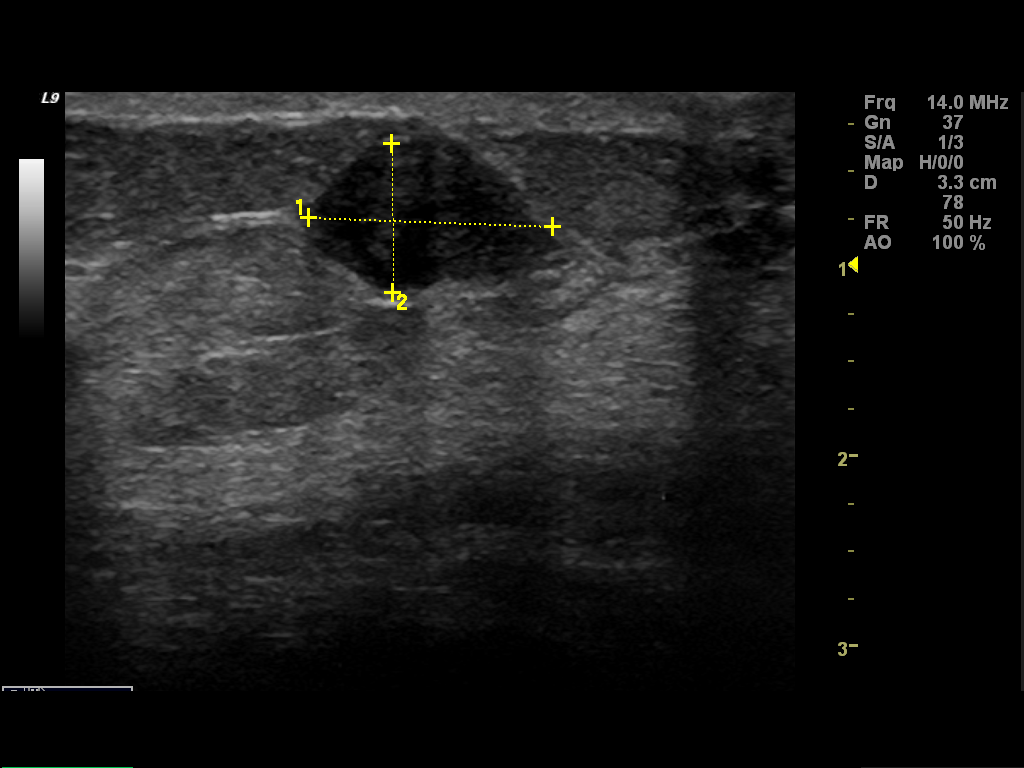
[im 3/5]
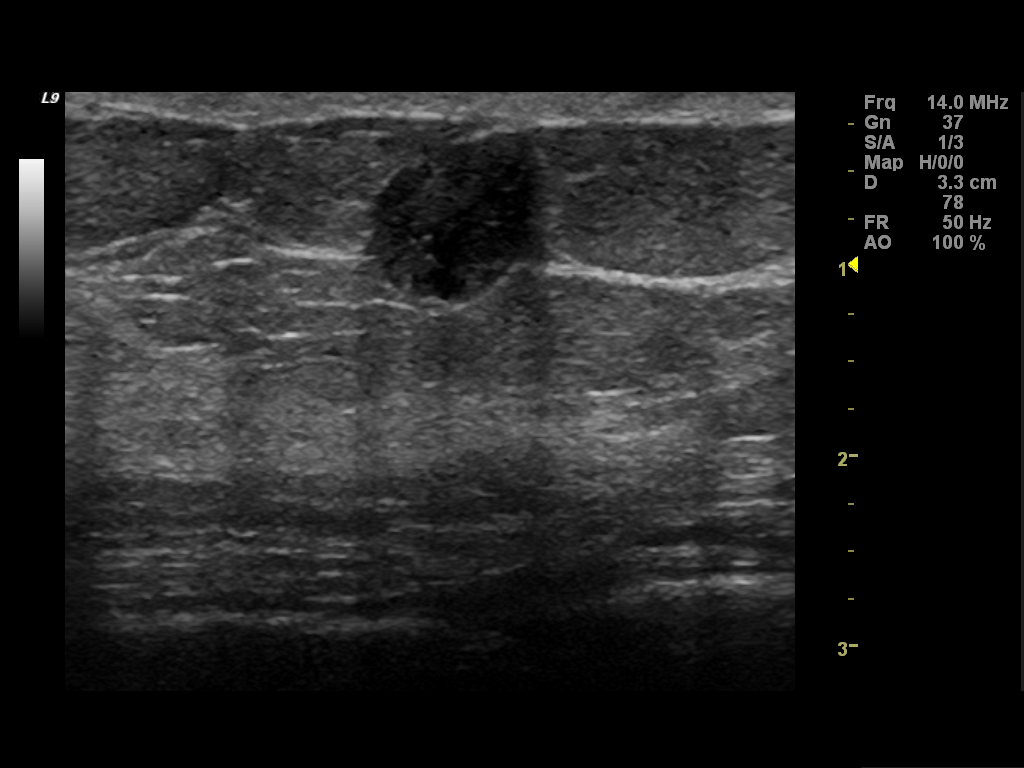
[im 4/5]
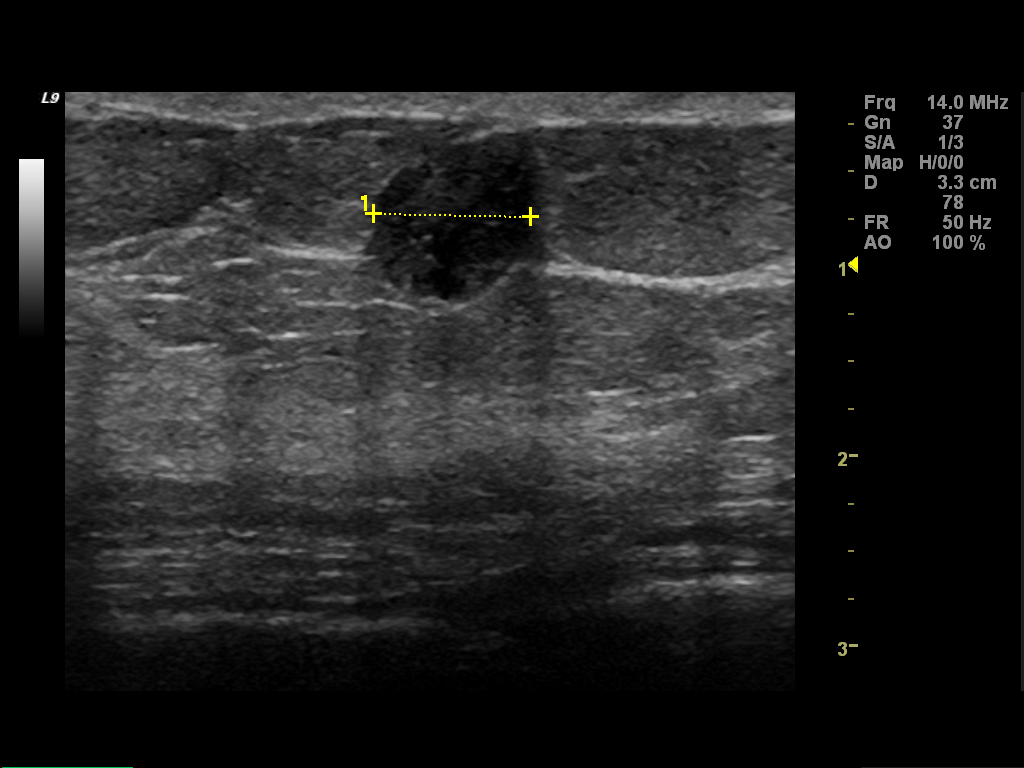
[im 5/5]
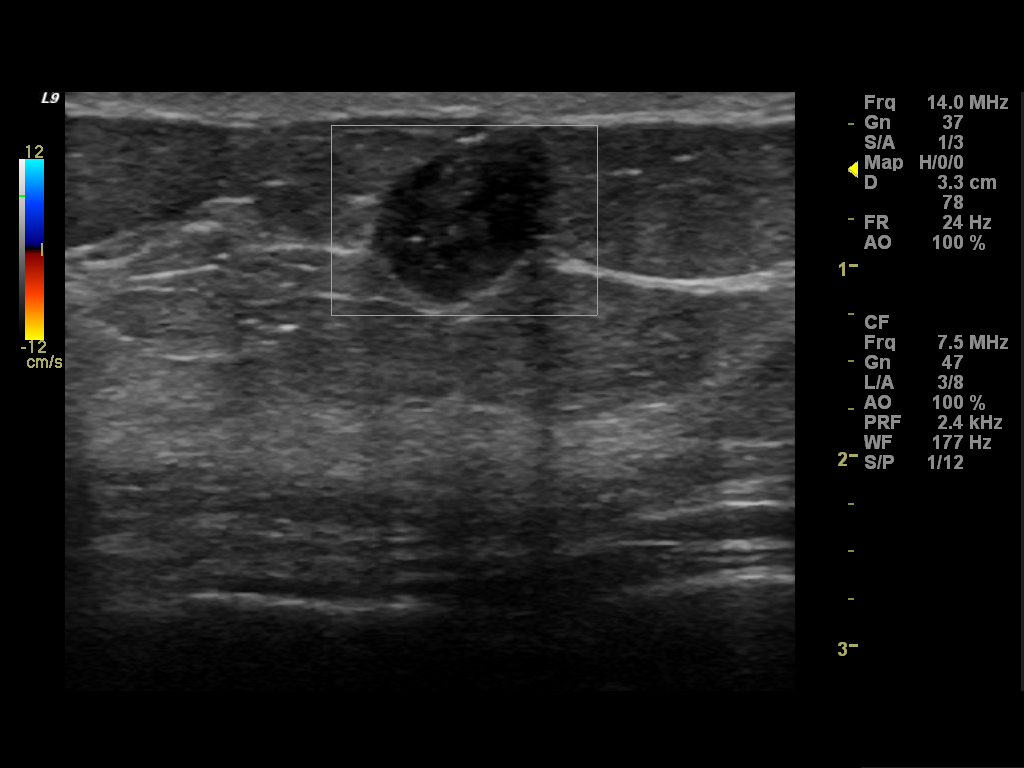

[5 of 5 positions shown; findings below may reference images not displayed]

IMAGES IMPORTED FROM THE SYNGO WORKFLOW SYSTEM
NO DICTATION FOR STUDY

## 2011-06-15 IMAGING — US US OUTSIDE FILMS SOFT TISSUE NECK
1 series · 8 of 8 positions shown · non-contrast
Comparison: none

[Series 2: us outside films soft tissue neck · 8 of 8 slices shown]
[im 1/8]
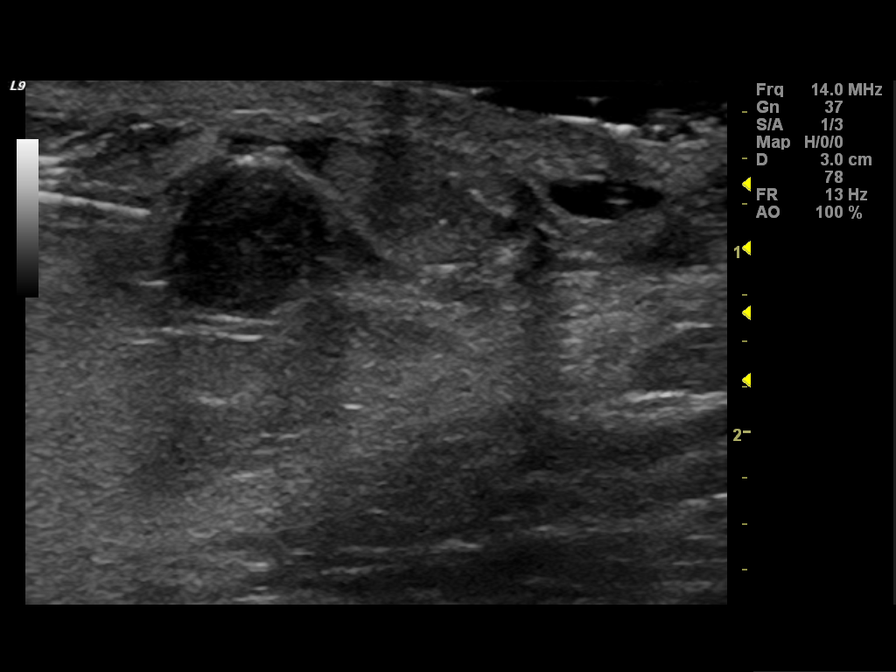
[im 2/8]
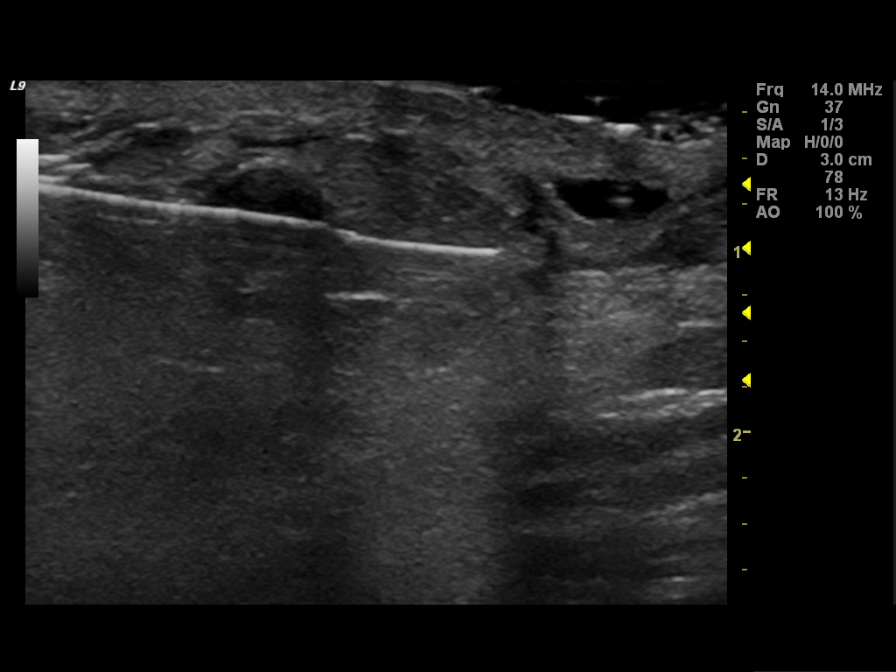
[im 3/8]
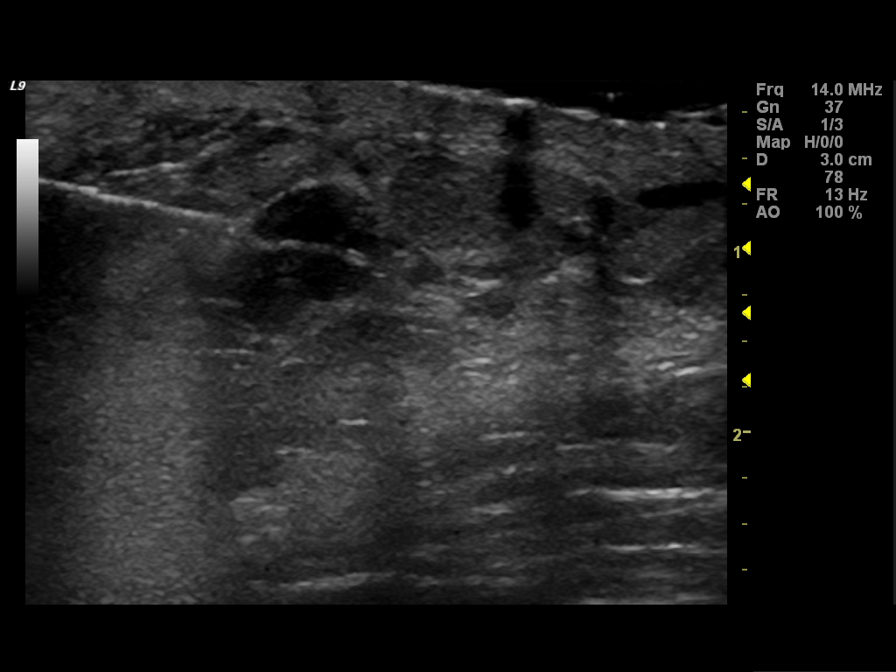
[im 4/8]
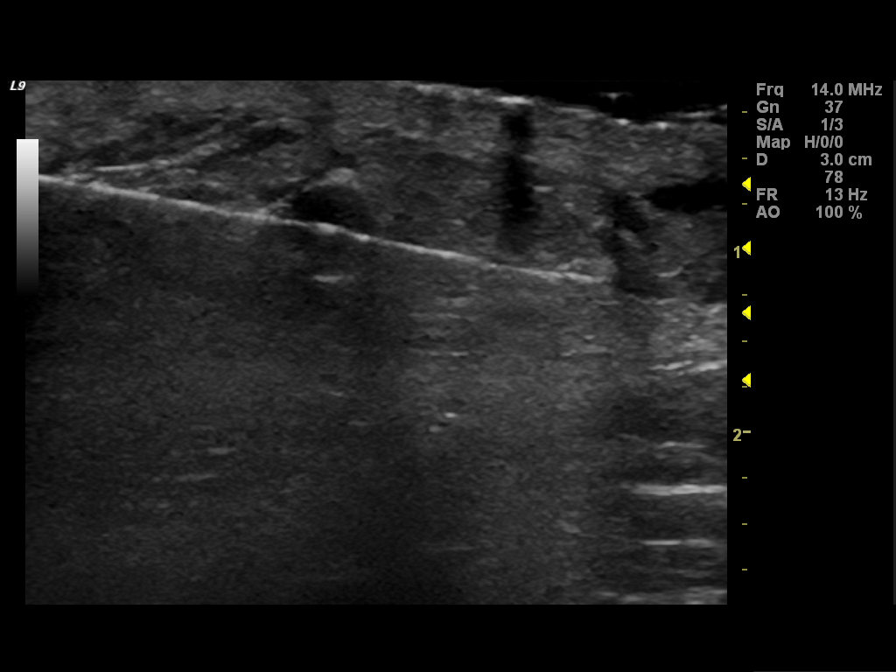
[im 5/8]
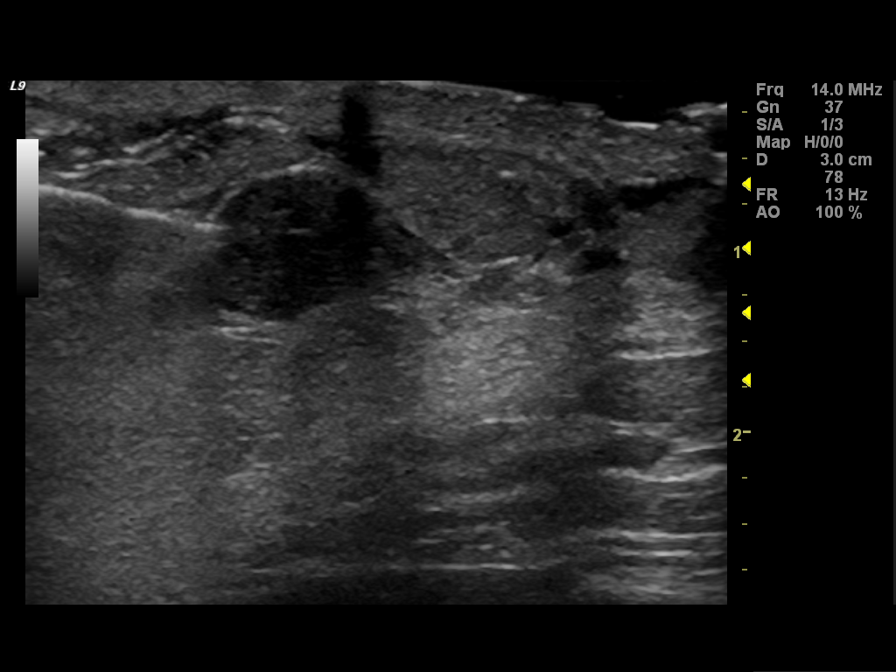
[im 6/8]
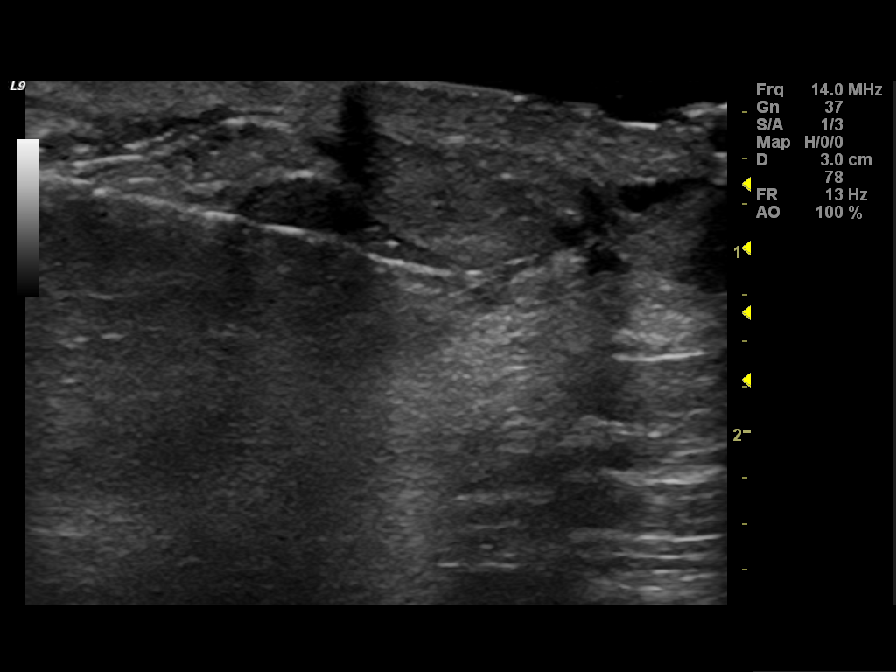
[im 7/8]
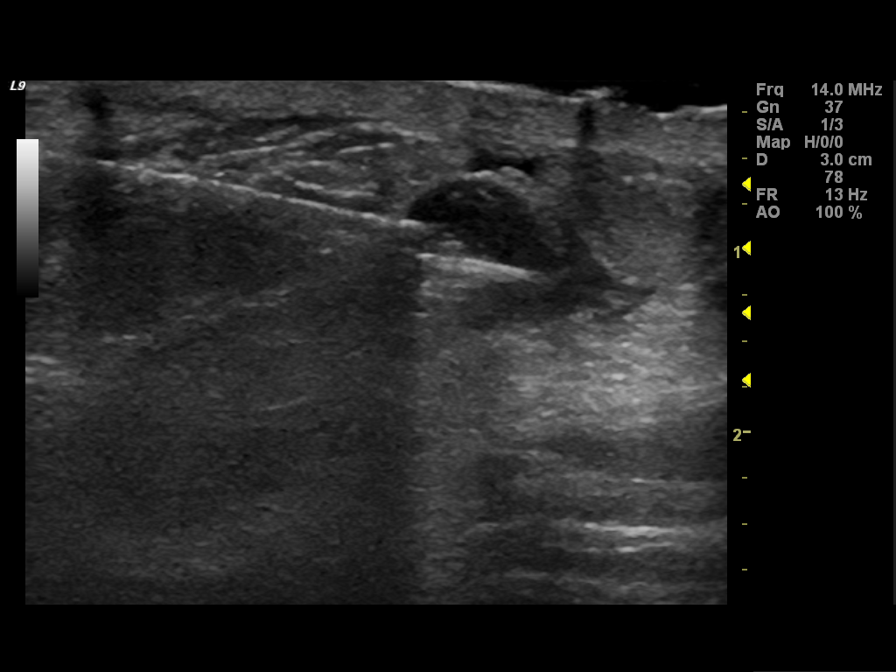
[im 8/8]
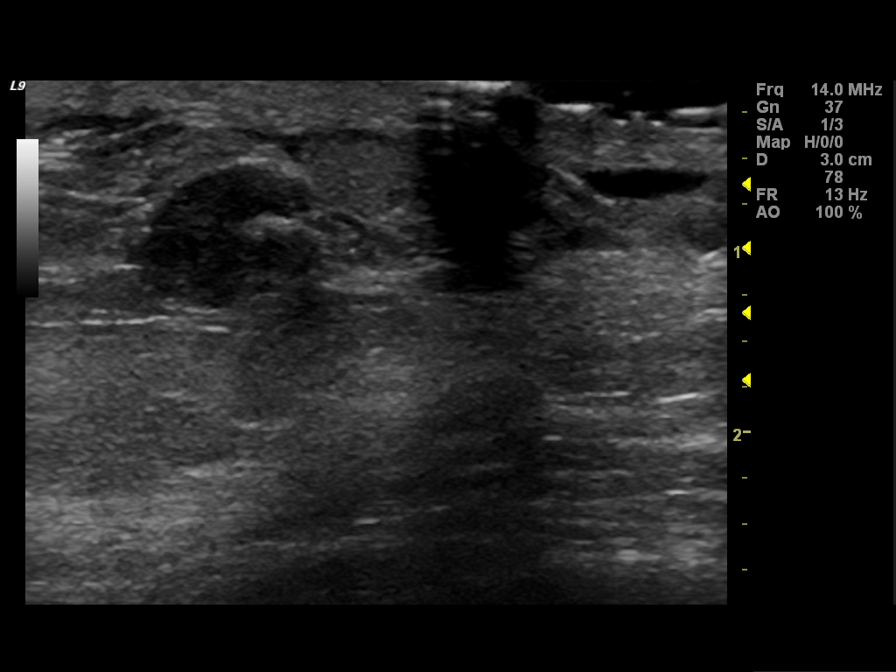

[8 of 8 positions shown; findings below may reference images not displayed]

Canned report from images found in remote index.

Refer to host system for actual result text.

## 2011-08-27 HISTORY — PX: BREAST BIOPSY: SHX20

## 2011-11-06 ENCOUNTER — Other Ambulatory Visit: Payer: Self-pay | Admitting: *Deleted

## 2011-11-06 MED ORDER — SERTRALINE HCL 100 MG PO TABS: ORAL_TABLET | ORAL | Status: DC

## 2011-11-13 ENCOUNTER — Ambulatory Visit (INDEPENDENT_AMBULATORY_CARE_PROVIDER_SITE_OTHER): Payer: No Typology Code available for payment source | Admitting: Internal Medicine

## 2011-11-13 ENCOUNTER — Encounter: Payer: Self-pay | Admitting: Internal Medicine

## 2011-11-13 VITALS — BP 130/90 | HR 95 | Temp 98.3°F | Ht 64.0 in | Wt 159.0 lb

## 2011-11-13 DIAGNOSIS — J019 Acute sinusitis, unspecified: Secondary | ICD-10-CM

## 2011-11-13 DIAGNOSIS — N943 Premenstrual tension syndrome: Secondary | ICD-10-CM

## 2011-11-13 MED ORDER — AMOXICILLIN 500 MG PO TABS: 1000.0000 mg | ORAL_TABLET | Freq: Two times a day (BID) | ORAL | Status: AC

## 2011-11-13 NOTE — Progress Notes (Signed)
Subjective:    Patient ID: [name], female    DOB: 17-Mar-1971, 41 y.o.   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 ALPRAZolam (XANAX) 0.25 MG tablet Take 0.25 mg by mouth 2 (two) times daily as needed.        . butalbital-acetaminophen-caffeine (FIORICET) 50-325-40 MG per tablet Take 1 tablet by mouth every 4 (four) hours as needed.  30 tablet  0  . cetirizine (ZYRTEC) 10 MG tablet Take 10 mg by mouth daily as needed.        . Multiple Vitamin (MULTIVITAMIN) capsule Take 1 capsule by mouth daily.        . sertraline (ZOLOFT) 100 MG tablet Take one by mouth daily.  90 tablet  1    Allergies  Allergen Reactions  . Codeine Sulfate     REACTION: Nausea    Past Medical History  Diagnosis Date  . Diverticulitis   . Migraines   . Premenstrual dysphoric disorder   . Allergy   . Breast tumor     left breast  . Breast fibroadenoma     Past Surgical History  Procedure Date  . Tonsillectomy   . Cervical fusion     C6-7    Family History  Problem Relation Age of Onset  . Coronary artery disease Father   . Hypertension Father   . Colon cancer Maternal Uncle   . Colon cancer Maternal Grandmother   . Colon cancer Maternal Grandfather   . Colon cancer Paternal Grandmother   . Coronary artery disease Paternal Grandmother   . Colon cancer Paternal Grandfather     History   Social History  . Marital Status: Married    Spouse Name: N/A    Number of Children: 2  . Years of Education: N/A   Occupational History   . 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 Smoking status: Never Smoker   . Smokeless tobacco: Never Used  . Alcohol Use: No  . Drug Use: Not on file  . Sexually Active: Not on file   Other Topics Concern  . Not on file   Social History Narrative  . No narrative on file   Review of Systems Appetite is good Weight is up Has cut back on exercise due to working full time now    Objective:   Physical Exam  Constitutional: She appears well-developed and well-nourished. No distress.  HENT:  Mouth/Throat: Oropharynx is clear and moist. No oropharyngeal exudate.       Maxillary pressure without distinct tenderness Marked nasal congestion--esp on right--but mostly pale TMs occluded with cerumen  Neck: Normal range of motion. Neck supple. No thyromegaly present.  Pulmonary/Chest: Effort normal and breath sounds normal. No respiratory distress. She has no wheezes. She has no rales.  Lymphadenopathy:    She has no cervical adenopathy.  Psychiatric: She has a normal mood and affect. Her behavior is normal. Judgment and  thought content normal.          Assessment & Plan:

## 2011-11-13 NOTE — Assessment & Plan Note (Signed)
As well as labile affect at times, seasonal affective component and some anxiety She feels good on the medication Will continue---probably indefinitely

## 2011-11-13 NOTE — Assessment & Plan Note (Signed)
Ongoing symptoms for 2 months Probably more than just allergy Will try amoxil

## 2011-12-11 ENCOUNTER — Ambulatory Visit: Payer: Self-pay | Admitting: General Surgery

## 2011-12-30 ENCOUNTER — Ambulatory Visit: Payer: Self-pay | Admitting: Anesthesiology

## 2012-01-02 ENCOUNTER — Ambulatory Visit: Payer: Self-pay | Admitting: General Surgery

## 2012-03-12 ENCOUNTER — Other Ambulatory Visit: Payer: Self-pay | Admitting: Internal Medicine

## 2012-03-12 NOTE — Telephone Encounter (Signed)
Ok to fill? Last filled 04/2011

## 2012-03-12 NOTE — Telephone Encounter (Signed)
rx sent to pharmacy by e-script  

## 2012-03-12 NOTE — Telephone Encounter (Signed)
Okay #30 x 0 

## 2012-05-08 ENCOUNTER — Other Ambulatory Visit: Payer: Self-pay | Admitting: Internal Medicine

## 2012-05-08 NOTE — Telephone Encounter (Signed)
Electronic refill request

## 2012-05-09 NOTE — Telephone Encounter (Signed)
Okay to refill for a year 

## 2012-05-11 NOTE — Telephone Encounter (Signed)
rx sent to pharmacy by e-script  

## 2012-08-20 ENCOUNTER — Encounter: Payer: Self-pay | Admitting: Internal Medicine

## 2012-08-20 ENCOUNTER — Ambulatory Visit (INDEPENDENT_AMBULATORY_CARE_PROVIDER_SITE_OTHER): Payer: No Typology Code available for payment source | Admitting: Internal Medicine

## 2012-08-20 VITALS — BP 130/88 | HR 78 | Temp 98.3°F | Wt 166.0 lb

## 2012-08-20 DIAGNOSIS — M545 Low back pain: Secondary | ICD-10-CM

## 2012-08-20 NOTE — Progress Notes (Signed)
  Subjective:    Patient ID: [name], female    DOB: 09-23-1970, 41 y.o.   MRN: [id number]  HPI "I did something to my back" Awoke yesterday with severe pain No known injury or tasks that would be expected to cause a problem  Had herniated disc at L4-5 on MRI several years ago--mild No Rx at that point Now has the same kind of "electrical pain" which is worse than before It was more achy then  Pain in low back and radiates to both sides towards hips No radiation to legs No weakness or loss of sensation in legs  Tried her fioricet ---helped a little Aleve 2 each morning yesterday and today  Current Outpatient Prescriptions on File Prior to Visit  Medication Sig Dispense Refill  . ALPRAZolam (XANAX) 0.25 MG tablet Take 0.25 mg by mouth 2 (two) times daily as needed.        . butalbital-acetaminophen-caffeine (FIORICET, ESGIC) 50-325-40 MG per tablet TAKE ONE TABLET BY MOUTH EVERY 4 HOURS AS NEEDED  30 tablet  0  . cetirizine (ZYRTEC) 10 MG tablet Take 10 mg by mouth daily as needed.        . Multiple Vitamin (MULTIVITAMIN) capsule Take 1 capsule by mouth daily.        . sertraline (ZOLOFT) 100 MG tablet Take 1 tablet (100 mg total) by mouth daily.  90 tablet  3    Allergies  Allergen Reactions  . Codeine Sulfate     REACTION: Nausea    Past Medical History  Diagnosis Date  . Diverticulitis   . Migraines   . Premenstrual dysphoric disorder   . Allergy   . Breast tumor     left breast  . Breast fibroadenoma     Past Surgical History  Procedure Date  . Tonsillectomy   . Cervical fusion     C6-7    Family History  Problem Relation Age of Onset  . Coronary artery disease Father   . Hypertension Father   . Colon cancer Maternal Uncle   . Colon cancer Maternal Grandmother   . Colon cancer Maternal Grandfather   . Colon cancer Paternal Grandmother   . Coronary artery disease Paternal Grandmother   . Colon cancer Paternal Grandfather     History   Social  History  . Marital Status: Divorced    Spouse Name: N/A    Number of Children: 2  . Years of Education: N/A   Occupational History  . 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 Now starting as hospice nurse for [employer]    Social History Main Topics  . Smoking status: Never Smoker   . Smokeless tobacco: Never Used  . Alcohol Use: No  . Drug Use: Not on file  . Sexually Active: Not on file   Other Topics Concern  . Not on file   Social History Narrative   Divorce final 11/12   Review of Systems No change in urine or incontinence No trouble with bowels No rash    Objective:   Physical Exam  Constitutional: She appears well-developed and well-nourished.  Musculoskeletal:       No spine or rib tenderness No distinct lumbar tenderness (though that is where the pain is) SLR negative bilaterally  Neurological:       Antalgic gait No leg weakness 1+ patellar and ankle reflexes bilaterally Normal tone          Assessment & Plan:

## 2012-08-20 NOTE — Patient Instructions (Signed)
Please use the aleve 2 tabs twice a day Try heat Call if you are not much better within 2 weeks (would probably consider physical therapy vs chiropractor)

## 2012-08-20 NOTE — Assessment & Plan Note (Signed)
Findings do not suggest disc injury Severe muscle or ligament strain?  Will use heat and aleve Tramadol prn (she has some left over from breast biopsy to start with)

## 2012-10-03 ENCOUNTER — Encounter: Payer: Self-pay | Admitting: *Deleted

## 2012-10-03 DIAGNOSIS — G43909 Migraine, unspecified, not intractable, without status migrainosus: Secondary | ICD-10-CM | POA: Insufficient documentation

## 2012-10-03 DIAGNOSIS — N63 Unspecified lump in unspecified breast: Secondary | ICD-10-CM | POA: Insufficient documentation

## 2012-10-10 ENCOUNTER — Other Ambulatory Visit: Payer: Self-pay

## 2012-10-23 ENCOUNTER — Other Ambulatory Visit: Payer: Self-pay | Admitting: Internal Medicine

## 2012-10-23 NOTE — Telephone Encounter (Signed)
Rx called in as prescribed 

## 2012-10-23 NOTE — Telephone Encounter (Signed)
Dr.Letvak patient, ok to fill?

## 2012-10-23 NOTE — Telephone Encounter (Signed)
Will refil once in PCP's absence Px written for call in

## 2012-10-27 ENCOUNTER — Other Ambulatory Visit: Payer: Self-pay | Admitting: Internal Medicine

## 2012-10-28 NOTE — Telephone Encounter (Signed)
rx called in 10/23/12

## 2012-10-28 NOTE — Telephone Encounter (Signed)
Just sent this a week ago Find out what happened

## 2012-12-14 ENCOUNTER — Ambulatory Visit: Payer: Self-pay | Admitting: General Surgery

## 2012-12-15 ENCOUNTER — Encounter: Payer: Self-pay | Admitting: General Surgery

## 2012-12-30 ENCOUNTER — Ambulatory Visit (INDEPENDENT_AMBULATORY_CARE_PROVIDER_SITE_OTHER)
Admission: RE | Admit: 2012-12-30 | Discharge: 2012-12-30 | Disposition: A | Discharge status: Home / Self Care | Payer: No Typology Code available for payment source | Source: Ambulatory Visit | Attending: Internal Medicine | Admitting: Internal Medicine

## 2012-12-30 ENCOUNTER — Encounter: Payer: Self-pay | Admitting: Internal Medicine

## 2012-12-30 ENCOUNTER — Other Ambulatory Visit (HOSPITAL_COMMUNITY)
Admission: RE | Admit: 2012-12-30 | Discharge: 2012-12-30 | Disposition: A | Discharge status: Home / Self Care | Payer: No Typology Code available for payment source | Source: Ambulatory Visit | Attending: Internal Medicine | Admitting: Internal Medicine

## 2012-12-30 ENCOUNTER — Ambulatory Visit (INDEPENDENT_AMBULATORY_CARE_PROVIDER_SITE_OTHER): Payer: No Typology Code available for payment source | Admitting: Internal Medicine

## 2012-12-30 VITALS — BP 122/82 | HR 78 | Temp 98.3°F | Ht 64.0 in | Wt 162.0 lb

## 2012-12-30 DIAGNOSIS — R5383 Other fatigue: Secondary | ICD-10-CM

## 2012-12-30 DIAGNOSIS — R0602 Shortness of breath: Secondary | ICD-10-CM | POA: Insufficient documentation

## 2012-12-30 DIAGNOSIS — Z01419 Encounter for gynecological examination (general) (routine) without abnormal findings: Secondary | ICD-10-CM | POA: Insufficient documentation

## 2012-12-30 DIAGNOSIS — R5381 Other malaise: Secondary | ICD-10-CM | POA: Insufficient documentation

## 2012-12-30 DIAGNOSIS — Z0000 Encounter for general adult medical examination without abnormal findings: Secondary | ICD-10-CM

## 2012-12-30 LAB — CBC WITH DIFFERENTIAL/PLATELET
Basophils Absolute: 0 10*3/uL (ref 0.0–0.1)
Eosinophils Relative: 0.8 % (ref 0.0–5.0)
Hemoglobin: 13.8 g/dL (ref 12.0–15.0)
Lymphocytes Relative: 30.6 % (ref 12.0–46.0)
Monocytes Relative: 7.3 % (ref 3.0–12.0)
Neutro Abs: 4 10*3/uL (ref 1.4–7.7)
RBC: 4.94 Mil/uL (ref 3.87–5.11)
RDW: 14.3 % (ref 11.5–14.6)
WBC: 6.6 10*3/uL (ref 4.5–10.5)

## 2012-12-30 LAB — BASIC METABOLIC PANEL
Calcium: 8.8 mg/dL (ref 8.4–10.5)
GFR: 79.47 mL/min (ref >60.00)
Glucose, Bld: 83 mg/dL (ref 70–99)
Potassium: 3.7 mEq/L (ref 3.5–5.1)
Sodium: 136 mEq/L (ref 135–145)

## 2012-12-30 LAB — HEPATIC FUNCTION PANEL
AST: 20 U/L (ref 0–37)
Alkaline Phosphatase: 91 U/L (ref 39–117)
Bilirubin, Direct: 0 mg/dL (ref 0.0–0.3)
Total Protein: 7.3 g/dL (ref 6.0–8.3)

## 2012-12-30 LAB — SEDIMENTATION RATE: Sed Rate: 2 mm/hr (ref 0–22)

## 2012-12-30 MED ORDER — OMEPRAZOLE 20 MG PO CPDR: 20.0000 mg | DELAYED_RELEASE_CAPSULE | Freq: Every day | ORAL | Status: DC

## 2012-12-30 NOTE — Progress Notes (Signed)
Subjective:    Patient ID: [name], female    DOB: 06/09/1971, 42 y.o.   MRN: [id number]  HPI Here for physical  Concerned about her lungs More wheezing ---especially with exercise Thinks her laugh sounds coarse (like smoker's laugh) Not aware of sarcoidosis diagnosis  Still notes choking with swallowing liquids Occasionally has aspiration and has to cough up No problems with food or pills Better if she drinks with a straw Some heartburn lately---using prn zantac 75  Still "no energy" Gets 8-9 hours sleep a night Feels tired Does her work outs in the evenings--- gym or video at home Has to fight sleep in church or at meetings Awakens refreshed No history of sig snoring or apnea/gasping (does wear teeth guard)  Still sees Dr Evette Cristal for her breast exams  Current Outpatient Prescriptions on File Prior to Visit  Medication Sig Dispense Refill  . ALPRAZolam (XANAX) 0.25 MG tablet Take 0.25 mg by mouth 2 (two) times daily as needed.        . butalbital-acetaminophen-caffeine (FIORICET, ESGIC) 50-325-40 MG per tablet TAKE ONE TABLET BY MOUTH EVERY 4 HOURS AS NEEDED  30 tablet  0  . cetirizine (ZYRTEC) 10 MG tablet Take 10 mg by mouth daily as needed.        . Multiple Vitamin (MULTIVITAMIN) capsule Take 1 capsule by mouth daily.        . sertraline (ZOLOFT) 100 MG tablet Take 1 tablet (100 mg total) by mouth daily.  90 tablet  3   No current facility-administered medications on file prior to visit.    Allergies  Allergen Reactions  . Codeine Sulfate     REACTION: Nausea    Past Medical History  Diagnosis Date  . Diverticulitis   . Migraines   . Premenstrual dysphoric disorder   . Allergy   . Breast tumor     left breast  . Breast fibroadenoma   . Migraines   . Lump or mass in breast 2013    left breast    Past Surgical History  Procedure Laterality Date  . Tonsillectomy    . Cervical fusion      C6-7  . Back surgery  3/07    c6-c7 fused   . Breast  biopsy Left 2013  . Wisdom teeth removal   1996  . Mass excsion Left 2013    Family History  Problem Relation Age of Onset  . Coronary artery disease Father   . Hypertension Father   . Colon cancer Maternal Uncle   . Colon cancer Maternal Grandmother   . Colon cancer Maternal Grandfather   . Colon cancer Paternal Grandmother   . Coronary artery disease Paternal Grandmother   . Colon cancer Paternal Grandfather     History   Social History  . Marital Status: Divorced    Spouse Name: N/A    Number of Children: 2  . Years of Education: N/A   Occupational History  .  Hospice nurse   .      Social History Main Topics  . Smoking status: Never Smoker   . Smokeless tobacco: Never Used  . Alcohol Use: No  . Drug Use: Not on file  . Sexually Active: Not on file   Other Topics Concern  . Not on file   Social History Narrative   Divorce final 11/12   Review of Systems  Constitutional: Positive for fatigue. Negative for unexpected weight change.       Wears seat belt  HENT: Positive for congestion and rhinorrhea. Negative for hearing loss, dental problem and tinnitus.        Regular with dentist  Eyes: Negative for visual disturbance.       No diplopia or unilateral vision loss  Respiratory: Positive for cough and shortness of breath. Negative for chest tightness.   Cardiovascular: Negative for chest pain, palpitations and leg swelling.  Gastrointestinal: Negative for nausea, vomiting, abdominal pain, constipation and blood in stool.       Bloating and heartburn   Endocrine: Negative for cold intolerance and heat intolerance.  Genitourinary: Negative for dysuria, difficulty urinating and dyspareunia.       Condoms for birth control Periods regular  Musculoskeletal: Positive for back pain. Negative for joint swelling and arthralgias.       Chronic back pain  Skin: Negative for rash.       Painful sore area on right great toe---tried cryotherapy OTC without success   Allergic/Immunologic: Positive for environmental allergies. Negative for immunocompromised state.       Satisfied with zyrtec  Neurological: Positive for headaches. Negative for dizziness, syncope, weakness, light-headedness and numbness.  Psychiatric/Behavioral: Positive for dysphoric mood. Negative for sleep disturbance. The patient is not nervous/anxious.        Periodic mood "dips"--- not as bad now (usually premenstrual for 1-2 days)       Objective:   Physical Exam  Constitutional: She is oriented to person, place, and time. She appears well-developed and well-nourished. No distress.  HENT:  Head: Normocephalic and atraumatic.  Right Ear: External ear normal.  Left Ear: External ear normal.  Mouth/Throat: Oropharynx is clear and moist. No oropharyngeal exudate.  Eyes: Conjunctivae and EOM are normal. Pupils are equal, round, and reactive to light.  Neck: Normal range of motion. Neck supple. No thyromegaly present.  Cardiovascular: Normal rate, regular rhythm, normal heart sounds and intact distal pulses.  Exam reveals no gallop.   No murmur heard. Pulmonary/Chest: Effort normal and breath sounds normal. No respiratory distress. She has no wheezes. She has no rales.  Abdominal: Soft. There is no tenderness.  Genitourinary:  Normal introitus and cervix PAP done Bimanual negative  Musculoskeletal: She exhibits no edema and no tenderness.  Lymphadenopathy:    She has no cervical adenopathy.  Neurological: She is alert and oriented to person, place, and time.  Skin: No rash noted. No erythema.  Plantar wart on plantar right great toe  Psychiatric: She has a normal mood and affect. Her behavior is normal.          Assessment & Plan:

## 2012-12-30 NOTE — Assessment & Plan Note (Signed)
CXR looks okay Hilar prominence looks like aorta If nodes---would consider pulmonary evaluation (though she is unaware of any sarcoidosis diagnosis)  Spirometry normal Will try Rx for GERD since she has swallowing problems also Albuterol MDI for prn

## 2012-12-30 NOTE — Assessment & Plan Note (Signed)
Seems healthy Some concerns which we addressed PAP done Just had mammo and exam due by Dr Evette Cristal Will need tetanus next year

## 2012-12-30 NOTE — Assessment & Plan Note (Signed)
Non specific Will check labs Recheck in 3 months

## 2012-12-30 NOTE — Patient Instructions (Signed)
Please try the omeprazole daily on an empty stomach

## 2013-01-04 ENCOUNTER — Other Ambulatory Visit: Payer: Self-pay | Admitting: *Deleted

## 2013-01-04 ENCOUNTER — Encounter: Payer: Self-pay | Admitting: General Surgery

## 2013-01-04 ENCOUNTER — Ambulatory Visit (INDEPENDENT_AMBULATORY_CARE_PROVIDER_SITE_OTHER): Payer: No Typology Code available for payment source | Admitting: General Surgery

## 2013-01-04 VITALS — BP 124/72 | HR 72 | Resp 15 | Ht 64.0 in | Wt 166.0 lb

## 2013-01-04 DIAGNOSIS — D4860 Neoplasm of uncertain behavior of unspecified breast: Secondary | ICD-10-CM

## 2013-01-04 DIAGNOSIS — D4862 Neoplasm of uncertain behavior of left breast: Secondary | ICD-10-CM

## 2013-01-04 DIAGNOSIS — Z1231 Encounter for screening mammogram for malignant neoplasm of breast: Secondary | ICD-10-CM

## 2013-01-04 NOTE — Progress Notes (Signed)
Patient ID: [name], female   DOB: September 16, 1970, 42 y.o.   MRN: [id number]  Chief Complaint  Patient presents with  . Follow-up    mammogram    HPI [name] is a 42 y.o. female here for follow up of mammogram done on 12-11-12 @ [facility]. One year ago she had excisions of a low grade phylloides and a fibroadenoma from left breast. HPI  Past Medical History  Diagnosis Date  . Diverticulitis   . Migraines   . Premenstrual dysphoric disorder   . Allergy   . Breast tumor     left breast  . Breast fibroadenoma   . Migraines   . Lump or mass in breast 2013    left breast    Past Surgical History  Procedure Laterality Date  . Tonsillectomy    . Cervical fusion      C6-7  . Back surgery  3/07    c6-c7 fused   . Breast biopsy Left 2013  . Wisdom teeth removal   1996  . Mass excsion Left 2013    Family History  Problem Relation Age of Onset  . Coronary artery disease Father   . Hypertension Father   . Colon cancer Maternal Uncle   . Colon cancer Maternal Grandmother   . Colon cancer Maternal Grandfather   . Colon cancer Paternal Grandmother   . Coronary artery disease Paternal Grandmother   . Colon cancer Paternal Grandfather     Social History History  Substance Use Topics  . Smoking status: Never Smoker   . Smokeless tobacco: Never Used  . Alcohol Use: No    Allergies  Allergen Reactions  . Codeine Sulfate     REACTION: Nausea    Current Outpatient Prescriptions  Medication Sig Dispense Refill  . ALPRAZolam (XANAX) 0.25 MG tablet Take 0.25 mg by mouth 2 (two) times daily as needed.        . butalbital-acetaminophen-caffeine (FIORICET, ESGIC) 50-325-40 MG per tablet TAKE ONE TABLET BY MOUTH EVERY 4 HOURS AS NEEDED  30 tablet  0  . cetirizine (ZYRTEC) 10 MG tablet Take 10 mg by mouth daily as needed.        . Multiple Vitamin (MULTIVITAMIN) capsule Take 1 capsule by mouth daily.        Marland Kitchen omeprazole (PRILOSEC) 20 MG capsule Take 1 capsule (20 mg total) by  mouth daily.  30 capsule  11  . sertraline (ZOLOFT) 100 MG tablet Take 1 tablet (100 mg total) by mouth daily.  90 tablet  3   No current facility-administered medications for this visit.    Review of Systems Review of Systems  Constitutional: Negative.   HENT: Negative.   Respiratory: Negative.   Cardiovascular: Negative.     Blood pressure 124/72, pulse 72, resp. rate 15, height 5\' 4"  (1.626 m), weight 166 lb (75.297 kg), last menstrual period 12/27/2012.  Physical Exam Physical Exam  Constitutional: She appears well-developed and well-nourished.  Eyes: Conjunctivae are normal. No scleral icterus.  Neck: Neck supple.  Cardiovascular: Normal rate, regular rhythm and normal heart sounds.   Pulmonary/Chest: Effort normal and breath sounds normal. Right breast exhibits no inverted nipple, no mass, no nipple discharge, no skin change and no tenderness. Left breast exhibits no inverted nipple, no mass, no nipple discharge, no skin change and no tenderness.  Abdominal: Soft. Bowel sounds are normal.  Lymphadenopathy:    She has no cervical adenopathy.    She has no axillary adenopathy.  Data Reviewed Mammogram reviewed Cat 2  Assessment    History of phyllodes, stable exam     Plan    Follow up one year        SANKAR,SEEPLAPUTHUR G 01/05/2013, 6:42 AM

## 2013-01-04 NOTE — Patient Instructions (Addendum)
Follow up in one year screening mammogram.

## 2013-01-04 NOTE — Progress Notes (Signed)
Patient will be asked to return to the office in one year for a bilateral screening mammogram. 

## 2013-01-05 ENCOUNTER — Encounter: Payer: Self-pay | Admitting: General Surgery

## 2013-04-05 ENCOUNTER — Ambulatory Visit (INDEPENDENT_AMBULATORY_CARE_PROVIDER_SITE_OTHER): Payer: No Typology Code available for payment source | Admitting: Internal Medicine

## 2013-04-05 ENCOUNTER — Encounter: Payer: Self-pay | Admitting: Internal Medicine

## 2013-04-05 VITALS — BP 130/80 | HR 80 | Temp 97.7°F | Wt 166.0 lb

## 2013-04-05 DIAGNOSIS — K219 Gastro-esophageal reflux disease without esophagitis: Secondary | ICD-10-CM

## 2013-04-05 NOTE — Progress Notes (Signed)
  Subjective:    Patient ID: [name], female    DOB: [birth year], 42 y.o.   MRN: [id number]  HPI Doing "okay" Feels that the prilosec is helping Cough and getting choked sensation is better Forgets the omeprazole at times--likely only 3 days per week Still has sense that breathing isn't right during exercise  Has bought a house Does DVD exercises mostly now at home Has treadmill Plans to join Y  Current Outpatient Prescriptions on File Prior to Visit  Medication Sig Dispense Refill  . ALPRAZolam (XANAX) 0.25 MG tablet Take 0.25 mg by mouth 2 (two) times daily as needed.        . butalbital-acetaminophen-caffeine (FIORICET, ESGIC) 50-325-40 MG per tablet TAKE ONE TABLET BY MOUTH EVERY 4 HOURS AS NEEDED  30 tablet  0  . cetirizine (ZYRTEC) 10 MG tablet Take 10 mg by mouth daily as needed.        . Multiple Vitamin (MULTIVITAMIN) capsule Take 1 capsule by mouth daily.        Marland Kitchen omeprazole (PRILOSEC) 20 MG capsule Take 1 capsule (20 mg total) by mouth daily.  30 capsule  11  . sertraline (ZOLOFT) 100 MG tablet Take 1 tablet (100 mg total) by mouth daily.  90 tablet  3   No current facility-administered medications on file prior to visit.    Allergies  Allergen Reactions  . Codeine Sulfate     REACTION: Nausea    Past Medical History  Diagnosis Date  . Diverticulitis   . Migraines   . Premenstrual dysphoric disorder   . Allergy   . Breast tumor     left breast  . Breast fibroadenoma   . Migraines   . Lump or mass in breast 2013    left breast    Past Surgical History  Procedure Laterality Date  . Tonsillectomy    . Cervical fusion      C6-7  . Back surgery  3/07    c6-c7 fused   . Breast biopsy Left 2013  . Wisdom teeth removal   1996  . Mass excsion Left 2013    Family History  Problem Relation Age of Onset  . Coronary artery disease Father   . Hypertension Father   . Colon cancer Maternal Uncle   . Colon cancer Maternal Grandmother   . Colon cancer  Maternal Grandfather   . Colon cancer Paternal Grandmother   . Coronary artery disease Paternal Grandmother   . Colon cancer Paternal Grandfather     History   Social History  . Marital Status: Divorced    Spouse Name: N/A    Number of Children: 2  . Years of Education: N/A   Occupational History  .  Hospice nurse   .      Social History Main Topics  . Smoking status: Never Smoker   . Smokeless tobacco: Never Used  . Alcohol Use: No  . Drug Use: No  . Sexually Active: Not on file   Other Topics Concern  . Not on file   Social History Narrative   Divorce final 11/12   Review of Systems Sleeps okay No night cough Weight stable    Objective:   Physical Exam  Constitutional: She appears well-developed and well-nourished. No distress.  Pulmonary/Chest: Breath sounds normal. No respiratory distress. She has no wheezes. She has no rales.  Abdominal: Soft. There is no tenderness.          Assessment & Plan:

## 2013-04-05 NOTE — Assessment & Plan Note (Signed)
Seems to be the cause of her choking and cough Discussed taking the omeprazole daily for 6-8 weeks to heal any damaged mucosa---then okay to go every other day

## 2013-05-07 ENCOUNTER — Other Ambulatory Visit: Payer: Self-pay | Admitting: Internal Medicine

## 2013-06-25 ENCOUNTER — Ambulatory Visit (INDEPENDENT_AMBULATORY_CARE_PROVIDER_SITE_OTHER): Payer: No Typology Code available for payment source | Admitting: Family Medicine

## 2013-06-25 ENCOUNTER — Encounter: Payer: Self-pay | Admitting: Family Medicine

## 2013-06-25 VITALS — BP 144/84 | HR 84 | Temp 98.4°F | Wt 164.0 lb

## 2013-06-25 DIAGNOSIS — J019 Acute sinusitis, unspecified: Secondary | ICD-10-CM

## 2013-06-25 MED ORDER — AMOXICILLIN-POT CLAVULANATE 875-125 MG PO TABS: 1.0000 | ORAL_TABLET | Freq: Two times a day (BID) | ORAL | Status: AC

## 2013-06-25 NOTE — Patient Instructions (Signed)
You have a sinus infection, but likely viral. Push fluids and plenty of rest. Ibuprofen for discomfort. Nasal saline irrigation or neti pot to help drain sinuses. May use plain mucinex or immediate release guaifenesin with plenty of fluid to help mobilize mucous. If persistent or worsening symptoms or new fever >101, fill augmentin script provided today.

## 2013-06-25 NOTE — Progress Notes (Signed)
  Subjective:    Patient ID: [name], female    DOB: [birth year], 42 y.o.   MRN: [id number]  HPI CC: nasal congestion, ?sinusitis  4d h/o nasal congestion, R facial pain, R ear pain and drainage.  + ST from drainage.  Blowing nose with productive clear to yellow mucous.  + cough, dry.  No fevers/chills, tooth pain, abd pain, rashes, wheezing.  So far has tried OTC allergy /sinus med at night time and pseudophed and ibuprofen which helped some.  No h/o recurrent sinus infections.  No sick contacts at home. No smokers at home. No h/o asthma history  Past Medical History  Diagnosis Date  . Diverticulitis   . Migraines   . Premenstrual dysphoric disorder   . Allergy   . Breast tumor     left breast  . Breast fibroadenoma   . Migraines   . Lump or mass in breast 2013    left breast  . GERD (gastroesophageal reflux disease)      Review of Systems Per HPI    Objective:   Physical Exam  Nursing note and vitals reviewed. Constitutional: She appears well-developed and well-nourished. No distress.  HENT:  Head: Normocephalic and atraumatic.  Right Ear: Hearing and external ear normal.  Left Ear: Hearing, tympanic membrane and external ear normal.  Nose: No mucosal edema or rhinorrhea. Right sinus exhibits maxillary sinus tenderness. Right sinus exhibits no frontal sinus tenderness. Left sinus exhibits no maxillary sinus tenderness and no frontal sinus tenderness.  Mouth/Throat: Uvula is midline, oropharynx is clear and moist and mucous membranes are normal. No oropharyngeal exudate, posterior oropharyngeal edema, posterior oropharyngeal erythema or tonsillar abscesses.  Cerumen bilaterally. R canal disimpacted but still with residual cerumen, tender to disimpaction - irrigation performed  Eyes: Conjunctivae and EOM are normal. Pupils are equal, round, and reactive to light. No scleral icterus.  Neck: Normal range of motion. Neck supple.  Cardiovascular: Normal rate, regular  rhythm, normal heart sounds and intact distal pulses.   No murmur heard. Pulmonary/Chest: Effort normal and breath sounds normal. No respiratory distress. She has no wheezes. She has no rales.  Lymphadenopathy:    She has no cervical adenopathy.  Skin: Skin is warm and dry. No rash noted.       Assessment & Plan:

## 2013-06-25 NOTE — Assessment & Plan Note (Signed)
Anticipate viral etiology given short duration. Supportive treatment as per instructions. WASP script provided, along with reasons to treat with a bx. Pt is RN, reliable. Irrigation performed to get good visualization of R TM given discomfort - clear.

## 2013-07-01 ENCOUNTER — Other Ambulatory Visit: Payer: Self-pay

## 2013-07-15 ENCOUNTER — Other Ambulatory Visit: Payer: Self-pay | Admitting: *Deleted

## 2013-07-15 MED ORDER — SERTRALINE HCL 100 MG PO TABS: 100.0000 mg | ORAL_TABLET | Freq: Every day | ORAL | Status: DC

## 2013-08-12 ENCOUNTER — Other Ambulatory Visit: Payer: Self-pay | Admitting: Family Medicine

## 2013-08-16 ENCOUNTER — Other Ambulatory Visit: Payer: Self-pay | Admitting: *Deleted

## 2013-08-16 MED ORDER — BUTALBITAL-APAP-CAFFEINE 50-325-40 MG PO TABS: 1.0000 | ORAL_TABLET | ORAL | Status: DC | PRN

## 2013-08-16 NOTE — Telephone Encounter (Signed)
Last filled 10/23/12, rx was printed by mistake for Dr. Milinda Antis, please advise

## 2013-08-16 NOTE — Telephone Encounter (Signed)
Okay #30 x 0 

## 2013-08-16 NOTE — Telephone Encounter (Signed)
rx called into pharmacy

## 2013-09-22 ENCOUNTER — Encounter: Payer: Self-pay | Admitting: Internal Medicine

## 2013-09-22 ENCOUNTER — Ambulatory Visit (INDEPENDENT_AMBULATORY_CARE_PROVIDER_SITE_OTHER): Payer: No Typology Code available for payment source | Admitting: Internal Medicine

## 2013-09-22 VITALS — BP 140/80 | HR 69 | Temp 98.4°F | Wt 166.0 lb

## 2013-09-22 DIAGNOSIS — H109 Unspecified conjunctivitis: Secondary | ICD-10-CM

## 2013-09-22 MED ORDER — SULFACETAMIDE SODIUM 10 % OP SOLN: 2.0000 [drp] | OPHTHALMIC | Status: DC

## 2013-09-22 NOTE — Assessment & Plan Note (Signed)
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

## 2013-09-22 NOTE — Progress Notes (Signed)
Pre-visit discussion using our clinic review tool. No additional management support is needed unless otherwise documented below in the visit note.  

## 2013-09-22 NOTE — Progress Notes (Signed)
Subjective:    Patient ID: [name], female    DOB: [birth year], 43 y.o.   MRN: [id number]  HPI Having trouble with her right eye Soreness on bottom lid for the past couple of days Some redness in corner last night Now with baggy swelling under the eye  No drainage No injury No cold symptoms  Current Outpatient Prescriptions on File Prior to Visit  Medication Sig Dispense Refill  . ALPRAZolam (XANAX) 0.25 MG tablet Take 0.25 mg by mouth 2 (two) times daily as needed.        . butalbital-acetaminophen-caffeine (FIORICET, ESGIC) 50-325-40 MG per tablet Take 1 tablet by mouth every 4 (four) hours as needed for headache.  30 tablet  0  . cetirizine (ZYRTEC) 10 MG tablet Take 10 mg by mouth daily as needed.        . Multiple Vitamin (MULTIVITAMIN) capsule Take 1 capsule by mouth daily.        Marland Kitchen omeprazole (PRILOSEC) 20 MG capsule Take 1 capsule (20 mg total) by mouth daily.  30 capsule  11  . sertraline (ZOLOFT) 100 MG tablet Take 1 tablet (100 mg total) by mouth daily.  90 tablet  0   No current facility-administered medications on file prior to visit.    Allergies  Allergen Reactions  . Codeine Sulfate     REACTION: Nausea    Past Medical History  Diagnosis Date  . Diverticulitis   . Migraines   . Premenstrual dysphoric disorder   . Allergy   . Breast tumor     left breast  . Breast fibroadenoma   . Migraines   . Lump or mass in breast 2013    left breast  . GERD (gastroesophageal reflux disease)     Past Surgical History  Procedure Laterality Date  . Tonsillectomy    . Cervical fusion      C6-7  . Back surgery  3/07    c6-c7 fused   . Breast biopsy Left 2013  . Wisdom teeth removal   1996  . Mass excsion Left 2013    Family History  Problem Relation Age of Onset  . Coronary artery disease Father   . Hypertension Father   . Colon cancer Maternal Uncle   . Colon cancer Maternal Grandmother   . Colon cancer Maternal Grandfather   . Colon cancer  Paternal Grandmother   . Coronary artery disease Paternal Grandmother   . Colon cancer Paternal Grandfather   . Cancer Sister     breast--post-menopausal    History   Social History  . Marital Status: Divorced    Spouse Name: N/A    Number of Children: 2  . Years of Education: N/A   Occupational History  .  Hospice nurse   .      Social History Main Topics  . Smoking status: Never Smoker   . Smokeless tobacco: Never Used  . Alcohol Use: No  . Drug Use: No  . Sexual Activity: Not on file   Other Topics Concern  . Not on file   Social History Narrative   Divorce final 11/12   Review of Systems No vision change No headaches    Objective:   Physical Exam  Constitutional: She appears well-developed and well-nourished. No distress.  Eyes:  Left eye normal  Right eye has some edema under lower lid Slight red area by medial canthus in lower lid-- no mass Slight tarsal injection along lower lid  Assessment & Plan:

## 2013-10-12 ENCOUNTER — Encounter: Payer: Self-pay | Admitting: Internal Medicine

## 2013-10-13 ENCOUNTER — Other Ambulatory Visit: Payer: Self-pay

## 2013-10-13 MED ORDER — SERTRALINE HCL 100 MG PO TABS: 100.0000 mg | ORAL_TABLET | Freq: Every day | ORAL | Status: DC

## 2013-10-13 NOTE — Telephone Encounter (Signed)
Pt request refill sertraline to [facility]. Advised pt done.

## 2013-12-27 ENCOUNTER — Ambulatory Visit: Payer: No Typology Code available for payment source | Admitting: General Surgery

## 2013-12-29 ENCOUNTER — Ambulatory Visit: Payer: Self-pay | Admitting: General Surgery

## 2013-12-30 ENCOUNTER — Encounter: Payer: Self-pay | Admitting: General Surgery

## 2014-01-06 ENCOUNTER — Ambulatory Visit (INDEPENDENT_AMBULATORY_CARE_PROVIDER_SITE_OTHER): Payer: No Typology Code available for payment source | Admitting: General Surgery

## 2014-01-06 ENCOUNTER — Encounter: Payer: Self-pay | Admitting: General Surgery

## 2014-01-06 VITALS — BP 148/94 | HR 74 | Resp 12 | Ht 64.0 in | Wt 167.0 lb

## 2014-01-06 DIAGNOSIS — N6019 Diffuse cystic mastopathy of unspecified breast: Secondary | ICD-10-CM | POA: Insufficient documentation

## 2014-01-06 DIAGNOSIS — Z800 Family history of malignant neoplasm of digestive organs: Secondary | ICD-10-CM

## 2014-01-06 DIAGNOSIS — Z803 Family history of malignant neoplasm of breast: Secondary | ICD-10-CM | POA: Insufficient documentation

## 2014-01-06 NOTE — Progress Notes (Signed)
Patient ID: [name], female   DOB: Jan 02, 1971, 43 y.o.   MRN: [id number]  Chief Complaint  Patient presents with  . Follow-up    mammogram    HPI [name] is a 43 y.o. female.  who presents for her follow up breast evaluation. The most recent mammogram was done on 12-29-13.  Patient does perform regular self breast checks and gets regular mammograms done.  No new breast issues.  Sister at age 11 diagnosed with breast cancer Sept 2014.  HPI  Past Medical History  Diagnosis Date  . Diverticulitis   . Migraines   . Premenstrual dysphoric disorder   . Allergy   . Breast tumor     left breast  . Breast fibroadenoma   . Migraines   . Lump or mass in breast 2013    left breast  . GERD (gastroesophageal reflux disease)     Past Surgical History  Procedure Laterality Date  . Tonsillectomy    . Cervical fusion      C6-7  . Back surgery  3/07    c6-c7 fused   . Breast biopsy Left 2013  . Wisdom teeth removal   1996  . Mass excsion Left 2013    Family History  Problem Relation Age of Onset  . Coronary artery disease Father   . Hypertension Father   . Colon cancer Maternal Uncle   . Colon cancer Maternal Grandmother   . Colon cancer Maternal Grandfather   . Colon cancer Paternal Grandmother   . Coronary artery disease Paternal Grandmother   . Colon cancer Paternal Grandfather   . Cancer Sister 48    breast--post-menopausal    Social History History  Substance Use Topics  . Smoking status: Never Smoker   . Smokeless tobacco: Never Used  . Alcohol Use: No    Allergies  Allergen Reactions  . Codeine Sulfate     REACTION: Nausea    Current Outpatient Prescriptions  Medication Sig Dispense Refill  . ALPRAZolam (XANAX) 0.25 MG tablet Take 0.25 mg by mouth 2 (two) times daily as needed.        . butalbital-acetaminophen-caffeine (FIORICET, ESGIC) 50-325-40 MG per tablet Take 1 tablet by mouth every 4 (four) hours as needed for headache.  30 tablet  0  .  cetirizine (ZYRTEC) 10 MG tablet Take 10 mg by mouth daily as needed.        . Multiple Vitamin (MULTIVITAMIN) capsule Take 1 capsule by mouth daily.        Marland Kitchen omeprazole (PRILOSEC) 20 MG capsule Take 1 capsule (20 mg total) by mouth daily.  30 capsule  11  . sertraline (ZOLOFT) 100 MG tablet Take 1 tablet (100 mg total) by mouth daily.  30 tablet  3   No current facility-administered medications for this visit.    Review of Systems Review of Systems  Constitutional: Negative.   Respiratory: Negative.   Cardiovascular: Negative.     Blood pressure 148/94, pulse 74, resp. rate 12, height 5\' 4"  (1.626 m), weight 167 lb (75.751 kg), last menstrual period 12/13/2013.  Physical Exam Physical Exam  Constitutional: She is oriented to person, place, and time. She appears well-developed and well-nourished.  Eyes: Conjunctivae are normal. No scleral icterus.  Neck: Neck supple. No thyromegaly present.  Cardiovascular: Normal rate, regular rhythm and normal heart sounds.   Pulmonary/Chest: Effort normal and breath sounds normal. Right breast exhibits no inverted nipple, no mass, no nipple discharge, no skin change and no  tenderness. Left breast exhibits no inverted nipple, no mass, no nipple discharge, no skin change and no tenderness.  Abdominal: Soft. There is no hepatosplenomegaly. There is no tenderness.  Lymphadenopathy:    She has no cervical adenopathy.    She has no axillary adenopathy.  Neurological: She is alert and oriented to person, place, and time.  Skin: Skin is warm and dry.    Data Reviewed Mammogram reviewed and stable.  Assessment    Stable physical exam. FCD by prior biopsies. FH of colon cancer, FH of breast cancer    Plan    Follow up in one year with bilateral screening mammogram. Discussed genetic testing and screening colonoscopy based on history. Pt will think about it.       Seeplaputhur G Sankar 01/06/2014, 6:15 PM

## 2014-01-06 NOTE — Patient Instructions (Signed)
Continue self breast exams. Call office for any new breast issues or concerns. 

## 2014-01-11 ENCOUNTER — Telehealth: Payer: Self-pay | Admitting: *Deleted

## 2014-01-11 NOTE — Telephone Encounter (Signed)
Checked on Film[employer]video [employer]. They will apply to her insurance deductible and call if more than $375 based on her family history. Dr. Jamal Collin aware, will let patient decide.

## 2014-01-13 NOTE — Telephone Encounter (Signed)
Patient aware of genetic testing details. She will think about it and let us know what she decides.

## 2014-01-19 ENCOUNTER — Encounter: Payer: Self-pay | Admitting: Internal Medicine

## 2014-01-19 ENCOUNTER — Ambulatory Visit (INDEPENDENT_AMBULATORY_CARE_PROVIDER_SITE_OTHER): Payer: No Typology Code available for payment source | Admitting: Internal Medicine

## 2014-01-19 VITALS — BP 140/90 | HR 87 | Temp 98.3°F | Ht 64.0 in | Wt 165.0 lb

## 2014-01-19 DIAGNOSIS — Z0000 Encounter for general adult medical examination without abnormal findings: Secondary | ICD-10-CM

## 2014-01-19 DIAGNOSIS — Z23 Encounter for immunization: Secondary | ICD-10-CM

## 2014-01-19 DIAGNOSIS — K219 Gastro-esophageal reflux disease without esophagitis: Secondary | ICD-10-CM

## 2014-01-19 DIAGNOSIS — G43909 Migraine, unspecified, not intractable, without status migrainosus: Secondary | ICD-10-CM

## 2014-01-19 DIAGNOSIS — N943 Premenstrual tension syndrome: Secondary | ICD-10-CM

## 2014-01-19 NOTE — Progress Notes (Signed)
Subjective:    Patient ID: [name], female    DOB: April 18, 1971, 43 y.o.   MRN: [id number]  HPI Here for physical Doing well No new concerns Same job---loves it Has settled into home  Engaged and will be married in November  Continues on regular exercise schedule Goes to [employer] Structured eating pattern  Occasional migraines Uses 2-3 fioricet a month on average Over her mood issues but still would cycle with periods--comfortable with staying on the sertraline  prilosec is helping her reflux symptoms Does get symptoms if she misses a couple of doses No swallowing problems  Current Outpatient Prescriptions on File Prior to Visit  Medication Sig Dispense Refill  . butalbital-acetaminophen-caffeine (FIORICET, ESGIC) 50-325-40 MG per tablet Take 1 tablet by mouth every 4 (four) hours as needed for headache.  30 tablet  0  . cetirizine (ZYRTEC) 10 MG tablet Take 10 mg by mouth daily as needed.        . Multiple Vitamin (MULTIVITAMIN) capsule Take 1 capsule by mouth daily.        Marland Kitchen omeprazole (PRILOSEC) 20 MG capsule Take 1 capsule (20 mg total) by mouth daily.  30 capsule  11  . sertraline (ZOLOFT) 100 MG tablet Take 1 tablet (100 mg total) by mouth daily.  30 tablet  3   No current facility-administered medications on file prior to visit.    Allergies  Allergen Reactions  . Codeine Sulfate     REACTION: Nausea    Past Medical History  Diagnosis Date  . Diverticulitis   . Migraines   . Premenstrual dysphoric disorder   . Allergy   . Breast tumor     left breast  . Breast fibroadenoma   . Migraines   . Lump or mass in breast 2013    left breast  . GERD (gastroesophageal reflux disease)     Past Surgical History  Procedure Laterality Date  . Tonsillectomy    . Cervical fusion      C6-7  . Back surgery  3/07    c6-c7 fused   . Breast biopsy Left 2013  . Wisdom teeth removal   1996  . Mass excsion Left 2013    Family History  Problem Relation Age  of Onset  . Coronary artery disease Father   . Hypertension Father   . Colon cancer Maternal Uncle   . Colon cancer Maternal Grandmother   . Colon cancer Maternal Grandfather   . Colon cancer Paternal Grandmother   . Coronary artery disease Paternal Grandmother   . Colon cancer Paternal Grandfather   . Cancer Sister 40    breast--post-menopausal    History   Social History  . Marital Status: Divorced    Spouse Name: N/A    Number of Children: 2  . Years of Education: N/A   Occupational History  .  Hospice nurse   .      Social History Main Topics  . Smoking status: Never Smoker   . Smokeless tobacco: Never Used  . Alcohol Use: No  . Drug Use: No  . Sexual Activity: Not on file   Other Topics Concern  . Not on file   Social History Narrative   Divorce final 11/12   Review of Systems  Constitutional: Negative for fatigue and unexpected weight change.       Wears seat belt  HENT: Negative for dental problem, hearing loss and tinnitus.        Regular with dentist  Eyes: Negative for visual disturbance.       No diplopia or unilateral vision loss  Respiratory: Negative for cough, chest tightness and shortness of breath.   Cardiovascular: Positive for palpitations.       Rare sensation of her heart--at rest  Gastrointestinal: Negative for nausea, vomiting, abdominal pain, constipation and blood in stool.  Endocrine: Negative for cold intolerance and heat intolerance.  Genitourinary: Negative for dysuria, hematuria and dyspareunia.       Cycles are regular--slight increase in bleeding.   Musculoskeletal: Positive for back pain. Negative for arthralgias and joint swelling.       Intermittent low back pain from past HNP  Skin: Negative for rash.       No suspicious lesions  Allergic/Immunologic: Positive for environmental allergies. Negative for immunocompromised state.       Satisfied with cetirizine  Neurological: Positive for numbness and headaches. Negative for  dizziness, syncope, weakness and light-headedness.       Some AM hand numbness--goes away quickly. Rarely when driving  Hematological: Negative for adenopathy. Does not bruise/bleed easily.  Psychiatric/Behavioral: Negative for sleep disturbance and dysphoric mood. The patient is not nervous/anxious.        Objective:   Physical Exam  Constitutional: She is oriented to person, place, and time. She appears well-developed and well-nourished. No distress.  HENT:  Head: Normocephalic and atraumatic.  Right Ear: External ear normal.  Left Ear: External ear normal.  Mouth/Throat: Oropharynx is clear and moist. No oropharyngeal exudate.  Eyes: Conjunctivae and EOM are normal. Pupils are equal, round, and reactive to light.  Neck: Normal range of motion. Neck supple. No thyromegaly present.  Cardiovascular: Normal rate, regular rhythm, normal heart sounds and intact distal pulses.  Exam reveals no gallop.   No murmur heard. Pulmonary/Chest: Effort normal and breath sounds normal. No respiratory distress. She has no wheezes. She has no rales.  Abdominal: Soft. There is no tenderness.  Musculoskeletal: She exhibits no edema and no tenderness.  Lymphadenopathy:    She has no cervical adenopathy.  Neurological: She is alert and oriented to person, place, and time.  Skin: No rash noted. No erythema.  Psychiatric: She has a normal mood and affect. Her behavior is normal.          Assessment & Plan:

## 2014-01-19 NOTE — Progress Notes (Signed)
Pre visit review using our clinic review tool, if applicable. No additional management support is needed unless otherwise documented below in the visit note. 

## 2014-01-19 NOTE — Assessment & Plan Note (Signed)
Rarely needs the fioricet

## 2014-01-19 NOTE — Assessment & Plan Note (Signed)
Needs to continue the PPI 

## 2014-01-19 NOTE — Assessment & Plan Note (Addendum)
Healthy Will update Tdap Counseling done Congrats on upcoming marriage  Discussed---will defer any blood work

## 2014-01-19 NOTE — Assessment & Plan Note (Signed)
Happy staying on the sertraline

## 2014-01-19 NOTE — Addendum Note (Signed)
Addended by: Despina Hidden on: 01/19/2014 09:24 AM   Modules accepted: Orders

## 2014-02-16 ENCOUNTER — Other Ambulatory Visit: Payer: Self-pay | Admitting: *Deleted

## 2014-02-16 MED ORDER — SERTRALINE HCL 100 MG PO TABS: 100.0000 mg | ORAL_TABLET | Freq: Every day | ORAL | Status: DC

## 2014-04-18 ENCOUNTER — Other Ambulatory Visit: Payer: Self-pay

## 2014-04-18 MED ORDER — BUTALBITAL-APAP-CAFFEINE 50-325-40 MG PO TABS: 1.0000 | ORAL_TABLET | ORAL | Status: DC | PRN

## 2014-04-18 NOTE — Telephone Encounter (Signed)
Okay #30 x 0 

## 2014-04-18 NOTE — Telephone Encounter (Signed)
rx called into pharmacy

## 2014-04-18 NOTE — Telephone Encounter (Signed)
Pt left v/m requesting refill fioricet to [facility].Please advise.

## 2014-06-17 ENCOUNTER — Other Ambulatory Visit: Payer: Self-pay | Admitting: *Deleted

## 2014-06-17 MED ORDER — SERTRALINE HCL 100 MG PO TABS: 100.0000 mg | ORAL_TABLET | Freq: Every day | ORAL | Status: DC

## 2014-06-23 ENCOUNTER — Ambulatory Visit (INDEPENDENT_AMBULATORY_CARE_PROVIDER_SITE_OTHER): Payer: No Typology Code available for payment source | Admitting: Family Medicine

## 2014-06-23 ENCOUNTER — Encounter: Payer: Self-pay | Admitting: Family Medicine

## 2014-06-23 VITALS — BP 184/100 | HR 75 | Temp 99.6°F | Ht 64.0 in | Wt 168.5 lb

## 2014-06-23 DIAGNOSIS — N750 Cyst of Bartholin's gland: Secondary | ICD-10-CM

## 2014-06-23 MED ORDER — SULFAMETHOXAZOLE-TMP DS 800-160 MG PO TABS: 2.0000 | ORAL_TABLET | Freq: Two times a day (BID) | ORAL | Status: DC

## 2014-06-23 NOTE — Progress Notes (Signed)
Pre visit review using our clinic review tool, if applicable. No additional management support is needed unless otherwise documented below in the visit note. 

## 2014-06-23 NOTE — Progress Notes (Signed)
   Dr. Frederico Hamman T. Phil Michels, MD, [facility] [place], [address] Phone: [phone] Fax: [phone]  06/23/2014  Patient: [name], MRN: [id number], DOB: 1971/01/10, 43 y.o.  Primary Physician:  Viviana Simpler, MD  Chief Complaint: Cyst  Subjective:   [name] is a 42 y.o. very pleasant female patient who presents with the following:  Bartholin Gland cyst? Flying out 1 week to get married. The patient has a minimally tender to palpation cyst on the left side of her vulva.  This is approximately 1 cm.  No fever or chills.  Patient is an Therapist, sports. She reports that when she checks her blood pressu at home, it is normal, or approaching normal.  septra  Past Medical History, Surgical History, Social History, Family History, Problem List, Medications, and Allergies have been reviewed and updated if relevant.   GEN: No acute illnesses, no fevers, chills. GI: No n/v/d, eating normally Pulm: No SOB Interactive and getting along well at home.  Otherwise, ROS is as per the HPI.  Objective:   Filed Vitals:   06/23/14 1634  BP: 184/100  Pulse: 75  Temp: 99.6 F (37.6 C)  TempSrc: Oral  Height: 5\' 4"  (1.626 m)  Weight: 168 lb 8 oz (76.431 kg)    GEN: WDWN, NAD, Non-toxic, A & O x 3 HEENT: Atraumatic, Normocephalic. Neck supple. No masses, No LAD. Ears and Nose: No external deformity. CV: RRR, No M/G/R. No JVD. No thrill. No extra heart sounds. PULM: CTA B, no wheezes, crackles, rhonchi. No retractions. No resp. distress. No accessory muscle use. EXTR: No c/c/e NEURO Normal gait.  PSYCH: Normally interactive. Conversant. Not depressed or anxious appearing.  Calm demeanor.  GU: minimal to mildly tender cystlike structure, well demarcated on the left vulva. This portion of the physical examination was chaperoned by Hedy Camara, CMA.   Laboratory and Imaging Data:  Assessment and Plan:   Bartholin gland  cyst  This does not appear to be an abscess at this point, recommended warm compresses, sits baths, and antibiotics right now.  Recommended gynecological follow-up, given that these often need definitive operative management.  Follow-up: No Follow-up on file.  New Prescriptions   SULFAMETHOXAZOLE-TRIMETHOPRIM (BACTRIM DS) 800-160 MG PER TABLET    Take 2 tablets by mouth 2 (two) times daily.   No orders of the defined types were placed in this encounter.    Signed,  Maud Deed. Ahsan Esterline, MD   Patient's Medications  New Prescriptions   SULFAMETHOXAZOLE-TRIMETHOPRIM (BACTRIM DS) 800-160 MG PER TABLET    Take 2 tablets by mouth 2 (two) times daily.  Previous Medications   BUTALBITAL-ACETAMINOPHEN-CAFFEINE (FIORICET, ESGIC) 50-325-40 MG PER TABLET    Take 1 tablet by mouth every 4 (four) hours as needed for headache.   CETIRIZINE (ZYRTEC) 10 MG TABLET    Take 10 mg by mouth daily as needed.     MULTIPLE VITAMIN (MULTIVITAMIN) CAPSULE    Take 1 capsule by mouth daily.     NORETHIN ACE-ETH ESTRAD-FE (LOESTRIN 24 FE PO)    Take 1 tablet by mouth daily.   OMEPRAZOLE (PRILOSEC) 20 MG CAPSULE    Take 1 capsule (20 mg total) by mouth daily.   SERTRALINE (ZOLOFT) 100 MG TABLET    Take 1 tablet (100 mg total) by mouth daily.  Modified Medications   No medications on file  Discontinued Medications   No medications on file

## 2014-06-27 ENCOUNTER — Telehealth: Payer: Self-pay | Admitting: Internal Medicine

## 2014-06-27 ENCOUNTER — Encounter: Payer: Self-pay | Admitting: Family Medicine

## 2014-06-27 NOTE — Telephone Encounter (Signed)
Same day is fine

## 2014-06-27 NOTE — Telephone Encounter (Signed)
LVM on pt work phone to c/b and schedule. Ok to use same day slot per [place].

## 2014-06-27 NOTE — Telephone Encounter (Signed)
Pt called requesting an appointment with you sometime this week. Her BP is really high and has a cyst using abx for 4 days and is not getting any better. She is leaving the country next Monday and really wants an appt with you. You only have same day appt for the remainder of the week. Would it be okay to schedule her in a same day slot?  [phone]

## 2014-06-28 ENCOUNTER — Ambulatory Visit: Payer: No Typology Code available for payment source | Admitting: Internal Medicine

## 2014-07-01 ENCOUNTER — Ambulatory Visit (INDEPENDENT_AMBULATORY_CARE_PROVIDER_SITE_OTHER): Payer: No Typology Code available for payment source | Admitting: Internal Medicine

## 2014-07-01 ENCOUNTER — Encounter: Payer: Self-pay | Admitting: Internal Medicine

## 2014-07-01 VITALS — BP 150/100 | HR 90 | Temp 98.1°F | Wt 168.0 lb

## 2014-07-01 DIAGNOSIS — I10 Essential (primary) hypertension: Secondary | ICD-10-CM

## 2014-07-01 MED ORDER — SULFAMETHOXAZOLE-TRIMETHOPRIM 800-160 MG PO TABS: 1.0000 | ORAL_TABLET | Freq: Two times a day (BID) | ORAL | Status: DC

## 2014-07-01 MED ORDER — LOSARTAN POTASSIUM 50 MG PO TABS: 50.0000 mg | ORAL_TABLET | Freq: Every day | ORAL | Status: DC

## 2014-07-01 NOTE — Progress Notes (Signed)
Subjective:    Patient ID: [name], female    DOB: 1971/01/18, 43 y.o.   MRN: [id number]  HPI Flying to [facility] for her wedding on 11/9  BP seems to be up for some time She doesn't have any symptoms Was normal at gyn to get OCP On for maybe 3 months now  No change in chronic headaches Some increased DOE--has not been able to exercise as hard Mild dizziness  Having nurses at work check her BP 156/98-170/100  Bartholin's cyst is finally better Not painful Did use some warm compresses  Current Outpatient Prescriptions on File Prior to Visit  Medication Sig Dispense Refill  . butalbital-acetaminophen-caffeine (FIORICET, ESGIC) 50-325-40 MG per tablet Take 1 tablet by mouth every 4 (four) hours as needed for headache. 30 tablet 0  . cetirizine (ZYRTEC) 10 MG tablet Take 10 mg by mouth daily as needed.      . Multiple Vitamin (MULTIVITAMIN) capsule Take 1 capsule by mouth daily.      Marland Kitchen omeprazole (PRILOSEC) 20 MG capsule Take 1 capsule (20 mg total) by mouth daily. 30 capsule 11  . sertraline (ZOLOFT) 100 MG tablet Take 1 tablet (100 mg total) by mouth daily. 30 tablet 3  . sulfamethoxazole-trimethoprim (BACTRIM DS) 800-160 MG per tablet Take 2 tablets by mouth 2 (two) times daily. 40 tablet 0   No current facility-administered medications on file prior to visit.    Allergies  Allergen Reactions  . Codeine Sulfate     REACTION: Nausea    Past Medical History  Diagnosis Date  . Diverticulitis   . Migraines   . Premenstrual dysphoric disorder   . Allergy   . Breast tumor     left breast  . Breast fibroadenoma   . Migraines   . Lump or mass in breast 2013    left breast  . GERD (gastroesophageal reflux disease)     Past Surgical History  Procedure Laterality Date  . Tonsillectomy    . Cervical fusion      C6-7  . Back surgery  3/07    c6-c7 fused   . Breast biopsy Left 2013  . Wisdom teeth removal   1996  . Mass excsion Left 2013    Family History    Problem Relation Age of Onset  . Coronary artery disease Father   . Hypertension Father   . Colon cancer Maternal Uncle   . Colon cancer Maternal Grandmother   . Colon cancer Maternal Grandfather   . Colon cancer Paternal Grandmother   . Coronary artery disease Paternal Grandmother   . Colon cancer Paternal Grandfather   . Cancer Sister 45    breast--post-menopausal    History   Social History  . Marital Status: Divorced    Spouse Name: N/A    Number of Children: 2  . Years of Education: N/A   Occupational History  .  Hospice nurse   .      Social History Main Topics  . Smoking status: Never Smoker   . Smokeless tobacco: Never Used  . Alcohol Use: No  . Drug Use: No  . Sexual Activity: Not on file   Other Topics Concern  . Not on file   Social History Narrative   Divorce final 11/12   Review of Systems Has not been sleeping great this week---some anxiety No leg swelling No chest pain--does have some left shoulder pain and across upper back    Objective:   Physical Exam  Constitutional: She appears well-developed and well-nourished. No distress.  Neck: Normal range of motion. Neck supple. No thyromegaly present.  Cardiovascular: Normal rate, regular rhythm and normal heart sounds.  Exam reveals no gallop.   No murmur heard. Pulmonary/Chest: Effort normal and breath sounds normal. No respiratory distress. She has no wheezes. She has no rales.  Musculoskeletal: She exhibits no edema.  Lymphadenopathy:    She has no cervical adenopathy.  Psychiatric: She has a normal mood and affect. Her behavior is normal.          Assessment & Plan:

## 2014-07-01 NOTE — Assessment & Plan Note (Signed)
BP Readings from Last 3 Encounters:  07/01/14 150/100  06/23/14 184/100  01/19/14 140/90   Repeat 164/100 on right  Has had some borderline high measurements but now much higher No clear cut symptoms from this ?related to OCP---- fiancee now planning vasectomy so she will hopefully go off OCP soon Will try losartan for now reeval 3 months--hopefully will be able to come off eventually

## 2014-07-01 NOTE — Progress Notes (Signed)
Pre visit review using our clinic review tool, if applicable. No additional management support is needed unless otherwise documented below in the visit note. 

## 2014-07-04 ENCOUNTER — Telehealth: Payer: Self-pay | Admitting: Internal Medicine

## 2014-07-04 NOTE — Telephone Encounter (Signed)
emmi emailed °

## 2014-10-04 ENCOUNTER — Encounter: Payer: Self-pay | Admitting: Internal Medicine

## 2014-10-04 ENCOUNTER — Ambulatory Visit (INDEPENDENT_AMBULATORY_CARE_PROVIDER_SITE_OTHER): Payer: No Typology Code available for payment source | Admitting: Internal Medicine

## 2014-10-04 VITALS — BP 170/100 | HR 86 | Temp 98.6°F | Wt 168.8 lb

## 2014-10-04 DIAGNOSIS — I10 Essential (primary) hypertension: Secondary | ICD-10-CM

## 2014-10-04 MED ORDER — LOSARTAN POTASSIUM-HCTZ 100-25 MG PO TABS: 1.0000 | ORAL_TABLET | Freq: Every day | ORAL | Status: DC

## 2014-10-04 NOTE — Assessment & Plan Note (Signed)
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

## 2014-10-04 NOTE — Progress Notes (Signed)
Subjective:    Patient ID: [name], female    DOB: 1971-08-09, 44 y.o.   MRN: [id number]  HPI Here for follow up of HTN Checking and BP still up some so she increased the losartan to bid Remains high still but better No problems with med-- 146/91--161/101 at first Now some better--- 122/85 to 164/98  Mild dizziness----no change-not orthostatic but somewhat "swimmy headed" all day No change in chronic headaches No throat symptoms Gets SOB easy  Current Outpatient Prescriptions on File Prior to Visit  Medication Sig Dispense Refill  . butalbital-acetaminophen-caffeine (FIORICET, ESGIC) 50-325-40 MG per tablet Take 1 tablet by mouth every 4 (four) hours as needed for headache. 30 tablet 0  . cetirizine (ZYRTEC) 10 MG tablet Take 10 mg by mouth daily as needed.      . LO LOESTRIN FE 1 MG-10 MCG / 10 MCG tablet Take 1 tablet by mouth daily.     Marland Kitchen losartan (COZAAR) 50 MG tablet Take 1 tablet (50 mg total) by mouth daily. 90 tablet 3  . Multiple Vitamin (MULTIVITAMIN) capsule Take 1 capsule by mouth daily.      Marland Kitchen omeprazole (PRILOSEC) 20 MG capsule Take 1 capsule (20 mg total) by mouth daily. 30 capsule 11  . sertraline (ZOLOFT) 100 MG tablet Take 1 tablet (100 mg total) by mouth daily. 30 tablet 3   No current facility-administered medications on file prior to visit.    Allergies  Allergen Reactions  . Codeine Sulfate     REACTION: Nausea    Past Medical History  Diagnosis Date  . Diverticulitis   . Migraines   . Premenstrual dysphoric disorder   . Allergy   . Breast tumor     left breast  . Breast fibroadenoma   . Migraines   . Lump or mass in breast 2013    left breast  . GERD (gastroesophageal reflux disease)   . Hypertension     Past Surgical History  Procedure Laterality Date  . Tonsillectomy    . Cervical fusion      C6-7  . Back surgery  3/07    c6-c7 fused   . Breast biopsy Left 2013  . Wisdom teeth removal   1996  . Mass excsion Left 2013     Family History  Problem Relation Age of Onset  . Coronary artery disease Father   . Hypertension Father   . Colon cancer Maternal Uncle   . Colon cancer Maternal Grandmother   . Colon cancer Maternal Grandfather   . Colon cancer Paternal Grandmother   . Coronary artery disease Paternal Grandmother   . Colon cancer Paternal Grandfather   . Cancer Sister 71    breast--post-menopausal    History   Social History  . Marital Status: Divorced    Spouse Name: N/A    Number of Children: 2  . Years of Education: N/A   Occupational History  .  Hospice nurse   .      Social History Main Topics  . Smoking status: Never Smoker   . Smokeless tobacco: Never Used  . Alcohol Use: No  . Drug Use: No  . Sexual Activity: Not on file   Other Topics Concern  . Not on file   Social History Narrative   Divorce final 11/12   Review of Systems Stopped OCP-- worried it was increasing BP Using back up contraception Sleeping okay Married life is good    Objective:   Physical Exam  Constitutional: She appears well-developed and well-nourished. No distress.  Neck: Normal range of motion. Neck supple. No thyromegaly present.  Cardiovascular: Normal rate, regular rhythm and normal heart sounds.  Exam reveals no gallop.   No murmur heard. Pulmonary/Chest: Effort normal and breath sounds normal. No respiratory distress. She has no wheezes. She has no rales.  Musculoskeletal: She exhibits no edema.  Lymphadenopathy:    She has no cervical adenopathy.          Assessment & Plan:

## 2014-10-04 NOTE — Progress Notes (Signed)
Pre visit review using our clinic review tool, if applicable. No additional management support is needed unless otherwise documented below in the visit note. 

## 2014-10-12 ENCOUNTER — Ambulatory Visit: Payer: Self-pay | Admitting: Obstetrics and Gynecology

## 2014-10-12 DIAGNOSIS — I10 Essential (primary) hypertension: Secondary | ICD-10-CM

## 2014-10-17 ENCOUNTER — Other Ambulatory Visit: Payer: Self-pay | Admitting: *Deleted

## 2014-10-17 MED ORDER — SERTRALINE HCL 100 MG PO TABS
100.0000 mg | ORAL_TABLET | Freq: Every day | ORAL | Status: DC
Start: 2014-10-17 — End: 2016-01-18

## 2014-10-17 NOTE — Telephone Encounter (Signed)
Received refill request electronically from pharmacy. Refill sent to pharmacy electronically.

## 2014-10-20 ENCOUNTER — Ambulatory Visit: Payer: Self-pay | Admitting: Obstetrics and Gynecology

## 2014-10-20 HISTORY — PX: DILATION AND CURETTAGE OF UTERUS: SHX78

## 2014-12-08 ENCOUNTER — Other Ambulatory Visit: Payer: Self-pay

## 2014-12-08 DIAGNOSIS — Z1231 Encounter for screening mammogram for malignant neoplasm of breast: Secondary | ICD-10-CM

## 2014-12-19 LAB — SURGICAL PATHOLOGY

## 2014-12-25 HISTORY — PX: OTHER SURGICAL HISTORY: SHX169

## 2014-12-25 NOTE — Op Note (Signed)
PATIENT NAME:  [name], [name] MR#:  [id number] DATE OF BIRTH:  12/17/1970  DATE OF PROCEDURE:  10/20/2014  PREOPERATIVE DIAGNOSES: Menorrhagia and endometrial polyp.   POSTOPERATIVE DIAGNOSES: Menorrhagia and endometrial polyp.  OPERATION PERFORMED: Hysteroscopy and targeted dilatation and curettage using MyoSure system.   ANESTHESIA: General.   PRIMARY SURGEON: Stoney Bang. Georgianne Fick, MD   PREOPERATIVE ANTIBIOTICS: None.   ESTIMATED BLOOD LOSS: 10 mL   OPERATIVE FLUIDS: Crystalloid 800 mL.   URINE OUTPUT: 10 mL.  COMPLICATIONS: None.   FINDINGS: Normal cervix, normal tubal ostia, 1 cm endometrial polyp arising from posterior lower uterine segment, cavity otherwise normal.   SPECIMENS REMOVED: Endometrial polyp.   CONDITION FOLLOWING PROCEDURE: Stable.   PROCEDURE IN DETAIL: Risks, benefits, and alternatives of the procedure were discussed with the patient prior to proceeding to the operating room. The patient was taken to the operating room where she was placed under general endotracheal anesthesia. She was positioned in the dorsal lithotomy position using Allen stirrups, prepped and draped in the usual sterile fashion. A timeout was performed. Attention was turned to the patient's pelvis. The bladder straight catheterized with a red rubber catheter. The operative speculum was placed. The anterior lip of the cervix was grasped with a single-tooth tenaculum. The cervix was sequentially dilated using Pratt dilators. A MyoSure scope was then advanced into the uterine cavity. There was a small false tract, which had been created during the dilation, which was followed with the scope and appeared hemostatic and involved for approximately a 1-1.5 cm portion of the lower uterine segment. The evaluation of the cavity revealed grossly normal findings as far as cavity contour and shape other than the above-mentioned endometrial polyp, which arose from the posterior lower uterine segment. The polyp  was removed using the MyoSure polyp blade. A 4 quadrant random biopsy of the endometrium was also taken using the MyoSure. Following this, the cavity was inspected and the polyp resection site was noted to be hemostatic. The hysteroscope was removed. The single-tooth tenaculum was removed. The tenaculum sites were inspected and noted to be hemostatic. The speculum was removed. Sponge, needle, and instrument counts were correct x 2. The patient tolerated the procedure well and was taken to the recovery room in stable condition.    ____________________________ Stoney Bang. Georgianne Fick, MD ams:bm D: 10/20/2014 17:07:41 ET T: 10/21/2014 03:12:14 ET JOB#: [id number]  cc: Stoney Bang. Georgianne Fick, MD, <Dictator> Dorthula Nettles MD ELECTRONICALLY SIGNED 11/01/2014 21:02

## 2015-01-10 ENCOUNTER — Ambulatory Visit
Admission: RE | Admit: 2015-01-10 | Discharge: 2015-01-10 | Disposition: A | Discharge status: Home / Self Care | Payer: PRIVATE HEALTH INSURANCE | Source: Ambulatory Visit | Attending: General Surgery | Admitting: General Surgery

## 2015-01-10 DIAGNOSIS — Z1231 Encounter for screening mammogram for malignant neoplasm of breast: Secondary | ICD-10-CM | POA: Insufficient documentation

## 2015-01-11 ENCOUNTER — Encounter: Payer: Self-pay | Admitting: General Surgery

## 2015-01-11 ENCOUNTER — Ambulatory Visit (INDEPENDENT_AMBULATORY_CARE_PROVIDER_SITE_OTHER): Payer: Managed Care, Other (non HMO) | Admitting: General Surgery

## 2015-01-11 VITALS — BP 124/78 | HR 80 | Resp 14 | Ht 64.0 in | Wt 170.0 lb

## 2015-01-11 DIAGNOSIS — Z803 Family history of malignant neoplasm of breast: Secondary | ICD-10-CM | POA: Diagnosis not present

## 2015-01-11 DIAGNOSIS — N6019 Diffuse cystic mastopathy of unspecified breast: Secondary | ICD-10-CM

## 2015-01-11 NOTE — Progress Notes (Signed)
Patient ID: [name], female   DOB: 1971/07/14, 44 y.o.   MRN: [id number]  No chief complaint on file.   HPI [name] is a 44 y.o. female who presents for a breast evaluation. The most recent mammogram was done on 01/04/15.Patient does perform regular self breast checks and gets regular mammograms done. She reports no new breast problems.     HPI  Past Medical History  Diagnosis Date  . Diverticulitis   . Migraines   . Premenstrual dysphoric disorder   . Allergy   . Breast fibroadenoma   . Migraines   . GERD (gastroesophageal reflux disease)   . Hypertension     Past Surgical History  Procedure Laterality Date  . Tonsillectomy    . Cervical fusion      C6-7  . Back surgery  3/07    c6-c7 fused   . Wisdom teeth removal   1996  . Breast biopsy Left 2013    negative  . Dilation and curettage of uterus  10/20/14    Family History  Problem Relation Age of Onset  . Coronary artery disease Father   . Hypertension Father   . Colon cancer Maternal Uncle   . Colon cancer Maternal Grandmother   . Colon cancer Maternal Grandfather   . Colon cancer Paternal Grandmother   . Coronary artery disease Paternal Grandmother   . Colon cancer Paternal Grandfather   . Cancer Sister 40    breast--post-menopausal  . Breast cancer Sister 58    Social History History  Substance Use Topics  . Smoking status: Never Smoker   . Smokeless tobacco: Never Used  . Alcohol Use: No    Allergies  Allergen Reactions  . Codeine Sulfate     REACTION: Nausea    Current Outpatient Prescriptions  Medication Sig Dispense Refill  . butalbital-acetaminophen-caffeine (FIORICET, ESGIC) 50-325-40 MG per tablet Take 1 tablet by mouth every 4 (four) hours as needed for headache. 30 tablet 0  . cetirizine (ZYRTEC) 10 MG tablet Take 10 mg by mouth daily as needed.      Marland Kitchen losartan-hydrochlorothiazide (HYZAAR) 100-25 MG per tablet Take 1 tablet by mouth daily. 30 tablet 11  . Multiple Vitamin  (MULTIVITAMIN) capsule Take 1 capsule by mouth daily.      Marland Kitchen omeprazole (PRILOSEC) 20 MG capsule Take 1 capsule (20 mg total) by mouth daily. 30 capsule 11  . sertraline (ZOLOFT) 100 MG tablet Take 1 tablet (100 mg total) by mouth daily. 30 tablet 5   No current facility-administered medications for this visit.    Review of Systems Review of Systems  Constitutional: Negative.   Respiratory: Negative.   Cardiovascular: Negative.     Blood pressure 124/78, pulse 80, resp. rate 14, height 5\' 4"  (1.626 m), weight 170 lb (77.111 kg), last menstrual period 12/29/2014.  Physical Exam Physical Exam  Constitutional: She is oriented to person, place, and time. She appears well-developed and well-nourished.  Eyes: Conjunctivae are normal. No scleral icterus.  Neck: Neck supple.  Cardiovascular: Normal rate, regular rhythm and normal heart sounds.   Pulmonary/Chest: Effort normal and breath sounds normal. Right breast exhibits no inverted nipple, no mass, no nipple discharge, no skin change and no tenderness. Left breast exhibits no inverted nipple, no mass, no nipple discharge, no skin change and no tenderness.  Abdominal: Soft. Bowel sounds are normal. There is no tenderness.  Lymphadenopathy:    She has no cervical adenopathy.    She has no axillary adenopathy.  Neurological: She is alert and oriented to person, place, and time.  Skin: Skin is warm and dry.    Data Reviewed Mammogram reviewed  Assessment    FCD. FH of breast cancer. Stable exam. Pt still has not decided on genetic testing     Plan    Return in 1 yr with bilateral screening mammogram     PCP: Dr Nonie Hoyer 01/11/2015, 6:16 PM

## 2015-01-11 NOTE — Patient Instructions (Addendum)
Follow up in one year. Call with your decision about genetic testing. Continue monthly self breast exam.

## 2015-01-25 ENCOUNTER — Encounter: Payer: Self-pay | Admitting: Internal Medicine

## 2015-01-25 ENCOUNTER — Ambulatory Visit (INDEPENDENT_AMBULATORY_CARE_PROVIDER_SITE_OTHER): Payer: PRIVATE HEALTH INSURANCE | Admitting: Internal Medicine

## 2015-01-25 VITALS — BP 122/82 | HR 77 | Temp 98.0°F | Ht 64.0 in | Wt 166.0 lb

## 2015-01-25 DIAGNOSIS — I10 Essential (primary) hypertension: Secondary | ICD-10-CM | POA: Diagnosis not present

## 2015-01-25 DIAGNOSIS — F39 Unspecified mood [affective] disorder: Secondary | ICD-10-CM

## 2015-01-25 DIAGNOSIS — R1314 Dysphagia, pharyngoesophageal phase: Secondary | ICD-10-CM | POA: Insufficient documentation

## 2015-01-25 DIAGNOSIS — Z0000 Encounter for general adult medical examination without abnormal findings: Secondary | ICD-10-CM

## 2015-01-25 LAB — LIPID PANEL
Cholesterol: 209 mg/dL — ABNORMAL HIGH (ref 0–200)
HDL: 49.3 mg/dL (ref >39.00)
LDL Cholesterol: 125 mg/dL — ABNORMAL HIGH (ref 0–99)
NonHDL: 159.7
Total CHOL/HDL Ratio: 4
Triglycerides: 173 mg/dL — ABNORMAL HIGH (ref 0.0–149.0)
VLDL: 34.6 mg/dL (ref 0.0–40.0)

## 2015-01-25 LAB — COMPREHENSIVE METABOLIC PANEL
ALBUMIN: 4.1 g/dL (ref 3.5–5.2)
ALT: 20 U/L (ref 0–35)
AST: 18 U/L (ref 0–37)
Alkaline Phosphatase: 101 U/L (ref 39–117)
BUN: 16 mg/dL (ref 6–23)
CALCIUM: 9.1 mg/dL (ref 8.4–10.5)
CHLORIDE: 100 meq/L (ref 96–112)
CO2: 31 meq/L (ref 19–32)
CREATININE: 0.76 mg/dL (ref 0.40–1.20)
GFR: 87.16 mL/min (ref >60.00)
GLUCOSE: 92 mg/dL (ref 70–99)
POTASSIUM: 3.4 meq/L — AB (ref 3.5–5.1)
Sodium: 137 mEq/L (ref 135–145)
Total Bilirubin: 0.5 mg/dL (ref 0.2–1.2)
Total Protein: 7.4 g/dL (ref 6.0–8.3)

## 2015-01-25 LAB — CBC WITH DIFFERENTIAL/PLATELET
Basophils Absolute: 0 10*3/uL (ref 0.0–0.1)
Basophils Relative: 0.3 % (ref 0.0–3.0)
EOS ABS: 0.1 10*3/uL (ref 0.0–0.7)
Eosinophils Relative: 0.7 % (ref 0.0–5.0)
HCT: 38.1 % (ref 36.0–46.0)
HEMOGLOBIN: 12.8 g/dL (ref 12.0–15.0)
LYMPHS ABS: 1.8 10*3/uL (ref 0.7–4.0)
LYMPHS PCT: 23.3 % (ref 12.0–46.0)
MCHC: 33.6 g/dL (ref 30.0–36.0)
MCV: 78.2 fl (ref 78.0–100.0)
Monocytes Absolute: 0.6 10*3/uL (ref 0.1–1.0)
Monocytes Relative: 7.4 % (ref 3.0–12.0)
Neutro Abs: 5.3 10*3/uL (ref 1.4–7.7)
Neutrophils Relative %: 68.3 % (ref 43.0–77.0)
Platelets: 364 10*3/uL (ref 150.0–400.0)
RBC: 4.88 Mil/uL (ref 3.87–5.11)
RDW: 14.3 % (ref 11.5–15.5)
WBC: 7.7 10*3/uL (ref 4.0–10.5)

## 2015-01-25 LAB — T4, FREE: FREE T4: 0.66 ng/dL (ref 0.60–1.60)

## 2015-01-25 MED ORDER — OMEPRAZOLE 20 MG PO CPDR: 20.0000 mg | DELAYED_RELEASE_CAPSULE | Freq: Two times a day (BID) | ORAL | Status: DC

## 2015-01-25 MED ORDER — BUTALBITAL-APAP-CAFFEINE 50-325-40 MG PO TABS: 1.0000 | ORAL_TABLET | ORAL | Status: DC | PRN

## 2015-01-25 NOTE — Assessment & Plan Note (Signed)
Mostly premenstrual dysthymia then trouble when marriage ending Will try to wean

## 2015-01-25 NOTE — Progress Notes (Signed)
Pre visit review using our clinic review tool, if applicable. No additional management support is needed unless otherwise documented below in the visit note. 

## 2015-01-25 NOTE — Assessment & Plan Note (Signed)
Briefly better on prilosec Will double dose See ENT for laryngoscopy  Consider GI if persists ([place])

## 2015-01-25 NOTE — Progress Notes (Signed)
Subjective:    Patient ID: [name], female    DOB: December 11, 1970, 44 y.o.   MRN: [id number]  HPI Here for physical No new concerns  Has been monitoring her BP Doing well since starting the HCTZ  Recent follow up with Dr Jamal Collin No problems  Current Outpatient Prescriptions on File Prior to Visit  Medication Sig Dispense Refill  . butalbital-acetaminophen-caffeine (FIORICET, ESGIC) 50-325-40 MG per tablet Take 1 tablet by mouth every 4 (four) hours as needed for headache. 30 tablet 0  . cetirizine (ZYRTEC) 10 MG tablet Take 10 mg by mouth daily as needed.      Marland Kitchen losartan-hydrochlorothiazide (HYZAAR) 100-25 MG per tablet Take 1 tablet by mouth daily. 30 tablet 11  . Multiple Vitamin (MULTIVITAMIN) capsule Take 1 capsule by mouth daily.      Marland Kitchen omeprazole (PRILOSEC) 20 MG capsule Take 1 capsule (20 mg total) by mouth daily. 30 capsule 11  . sertraline (ZOLOFT) 100 MG tablet Take 1 tablet (100 mg total) by mouth daily. 30 tablet 5   No current facility-administered medications on file prior to visit.    Allergies  Allergen Reactions  . Codeine Sulfate     REACTION: Nausea    Past Medical History  Diagnosis Date  . Diverticulitis   . Migraines   . Premenstrual dysphoric disorder   . Allergy   . Breast fibroadenoma   . Migraines   . GERD (gastroesophageal reflux disease)   . Hypertension     Past Surgical History  Procedure Laterality Date  . Tonsillectomy    . Cervical fusion      C6-7  . Back surgery  3/07    c6-c7 fused   . Wisdom teeth removal   1996  . Breast biopsy Left 2013    negative  . Dilation and curettage of uterus  10/20/14    Family History  Problem Relation Age of Onset  . Coronary artery disease Father   . Hypertension Father   . Colon cancer Maternal Uncle   . Colon cancer Maternal Grandmother   . Colon cancer Maternal Grandfather   . Colon cancer Paternal Grandmother   . Coronary artery disease Paternal Grandmother   . Colon cancer  Paternal Grandfather   . Cancer Sister 43    breast--post-menopausal  . Breast cancer Sister 88    History   Social History  . Marital Status: Married    Spouse Name: N/A  . Number of Children: 2  . Years of Education: N/A   Occupational History  .  Hospice nurse   .      Social History Main Topics  . Smoking status: Never Smoker   . Smokeless tobacco: Never Used  . Alcohol Use: No  . Drug Use: No  . Sexual Activity: Not on file   Other Topics Concern  . Not on file   Social History Narrative   Divorce final 11/12   Review of Systems  Constitutional: Negative for fatigue and unexpected weight change.       Continues to exercise regularly Wears seat belt  HENT: Negative for dental problem, hearing loss and tinnitus.        Keeps up with dentist  Eyes: Negative for visual disturbance.       No diplopia or unilateral vision loss  Respiratory: Negative for cough, chest tightness and shortness of breath.   Cardiovascular: Negative for chest pain, palpitations and leg swelling.  Gastrointestinal: Negative for nausea, vomiting, abdominal pain, constipation  and blood in stool.       Occ indigestion---mostly controlled with prilosec. Swallowing issues improved after starting the prilosec--now has returned (only with liquids)  Endocrine: Negative for polydipsia and polyuria.  Genitourinary: Negative for dysuria, hematuria, difficulty urinating and dyspareunia.       Recent D&C and then uterine ablation Hopes she won't be having periods anymore  Musculoskeletal: Positive for back pain. Negative for joint swelling and arthralgias.       Hurt back on the way over here--felt pop in back lifting computer bag  Skin: Negative for rash.       Has a couple of moles to be checked  Allergic/Immunologic: Positive for environmental allergies. Negative for immunocompromised state.       Okay with zyrtec  Neurological: Positive for headaches. Negative for dizziness, syncope, weakness,  light-headedness and numbness.  Psychiatric/Behavioral: Negative for sleep disturbance and dysphoric mood. The patient is not nervous/anxious.        Objective:   Physical Exam  Constitutional: She is oriented to person, place, and time. She appears well-developed and well-nourished. No distress.  HENT:  Head: Normocephalic and atraumatic.  Right Ear: External ear normal.  Left Ear: External ear normal.  Mouth/Throat: Oropharynx is clear and moist. No oropharyngeal exudate.  Eyes: Conjunctivae and EOM are normal. Pupils are equal, round, and reactive to light.  Neck: Normal range of motion. Neck supple. No thyromegaly present.  Cardiovascular: Normal rate, regular rhythm, normal heart sounds and intact distal pulses.  Exam reveals no gallop.   No murmur heard. Pulmonary/Chest: Effort normal and breath sounds normal. No respiratory distress. She has no wheezes. She has no rales.  Abdominal: Soft. There is no tenderness.  Musculoskeletal: She exhibits no edema or tenderness.  Lymphadenopathy:    She has no cervical adenopathy.  Neurological: She is alert and oriented to person, place, and time.  Skin: No rash noted.  ~61mm symmetric nevus right ankle (slightly darker) ~80mm nevus left calf Nothing immediately worrisome --will go to derm if they change further  Psychiatric: She has a normal mood and affect. Her behavior is normal.          Assessment & Plan:

## 2015-01-25 NOTE — Assessment & Plan Note (Signed)
Healthy Pap due next year Gets mammograms with Dr Jamal Collin

## 2015-01-25 NOTE — Assessment & Plan Note (Signed)
BP Readings from Last 3 Encounters:  01/25/15 122/82  01/11/15 124/78  10/04/14 170/100   Better now Continue the current med

## 2015-01-25 NOTE — Patient Instructions (Addendum)
Please call [facility] to evaluate the choking problems. If they don't come up with a diagnosis, let me know and I can refer you to a GI doctor.  You can cut the sertraline in half and only take 50mg  daily. If you have no problems after 1-2 months, you can try every other day for at least a month--then stop if no problems. If you have mood issues after that, just restart the 1/2 daily or contact me for instructions.

## 2015-01-26 ENCOUNTER — Telehealth: Payer: Self-pay | Admitting: Internal Medicine

## 2015-01-26 NOTE — Telephone Encounter (Signed)
Discussed with her Felt pop in mid back along bra line No weakness in arms or legs--just feels pain with breathing (though not SOB) Used ice, now heat Ibuprofen 600---- recommended tid for now Reassured--no features of a serious issue

## 2015-01-26 NOTE — Telephone Encounter (Signed)
Pt called in with back pain, she said that thinks she may have ruptured a disc in her back, She mentioned it to you yesterday at her appt, today it is much worse.  She is requesting tests to diagnose what the problem is. Please call pt back at number listed as work, pt is home today.  Thanks.

## 2015-05-09 ENCOUNTER — Encounter: Payer: Self-pay | Admitting: Internal Medicine

## 2015-05-09 DIAGNOSIS — K219 Gastro-esophageal reflux disease without esophagitis: Secondary | ICD-10-CM

## 2015-06-22 ENCOUNTER — Other Ambulatory Visit: Payer: Self-pay | Admitting: Nurse Practitioner

## 2015-06-22 DIAGNOSIS — R1310 Dysphagia, unspecified: Secondary | ICD-10-CM

## 2015-06-28 ENCOUNTER — Ambulatory Visit
Admission: RE | Admit: 2015-06-28 | Discharge: 2015-06-28 | Disposition: A | Discharge status: Home / Self Care | Payer: PRIVATE HEALTH INSURANCE | Source: Ambulatory Visit | Attending: Nurse Practitioner | Admitting: Nurse Practitioner

## 2015-06-28 ENCOUNTER — Other Ambulatory Visit: Payer: Self-pay | Admitting: Nurse Practitioner

## 2015-06-28 DIAGNOSIS — R1310 Dysphagia, unspecified: Secondary | ICD-10-CM | POA: Diagnosis present

## 2015-06-28 DIAGNOSIS — K449 Diaphragmatic hernia without obstruction or gangrene: Secondary | ICD-10-CM | POA: Diagnosis not present

## 2015-06-28 DIAGNOSIS — K219 Gastro-esophageal reflux disease without esophagitis: Secondary | ICD-10-CM | POA: Insufficient documentation

## 2015-10-09 ENCOUNTER — Other Ambulatory Visit: Payer: Self-pay | Admitting: Internal Medicine

## 2015-10-24 ENCOUNTER — Other Ambulatory Visit: Payer: Self-pay | Admitting: *Deleted

## 2015-10-24 DIAGNOSIS — Z1231 Encounter for screening mammogram for malignant neoplasm of breast: Secondary | ICD-10-CM

## 2015-11-06 ENCOUNTER — Telehealth: Payer: Self-pay | Admitting: Internal Medicine

## 2015-11-06 ENCOUNTER — Emergency Department (HOSPITAL_COMMUNITY)
Admission: EM | Admit: 2015-11-06 | Discharge: 2015-11-06 | Disposition: A | Discharge status: Home / Self Care | Payer: Managed Care, Other (non HMO) | Attending: Emergency Medicine | Admitting: Emergency Medicine

## 2015-11-06 ENCOUNTER — Encounter (HOSPITAL_COMMUNITY): Payer: Self-pay | Admitting: Family Medicine

## 2015-11-06 DIAGNOSIS — R1030 Lower abdominal pain, unspecified: Secondary | ICD-10-CM | POA: Insufficient documentation

## 2015-11-06 DIAGNOSIS — Z79899 Other long term (current) drug therapy: Secondary | ICD-10-CM | POA: Insufficient documentation

## 2015-11-06 DIAGNOSIS — I10 Essential (primary) hypertension: Secondary | ICD-10-CM | POA: Diagnosis not present

## 2015-11-06 DIAGNOSIS — K219 Gastro-esophageal reflux disease without esophagitis: Secondary | ICD-10-CM | POA: Insufficient documentation

## 2015-11-06 DIAGNOSIS — G43909 Migraine, unspecified, not intractable, without status migrainosus: Secondary | ICD-10-CM | POA: Insufficient documentation

## 2015-11-06 LAB — URINALYSIS, ROUTINE W REFLEX MICROSCOPIC
GLUCOSE, UA: NEGATIVE mg/dL
HGB URINE DIPSTICK: NEGATIVE
KETONES UR: 15 mg/dL — AB
Nitrite: POSITIVE — AB
Protein, ur: NEGATIVE mg/dL
Specific Gravity, Urine: 1.028 (ref 1.005–1.030)
pH: 6 (ref 5.0–8.0)

## 2015-11-06 LAB — URINE MICROSCOPIC-ADD ON

## 2015-11-06 MED ORDER — NITROFURANTOIN MONOHYD MACRO 100 MG PO CAPS
100.0000 mg | ORAL_CAPSULE | Freq: Once | ORAL | Status: DC
  Filled 2015-11-06: qty 1

## 2015-11-06 MED ORDER — PHENAZOPYRIDINE HCL 200 MG PO TABS: 200.0000 mg | ORAL_TABLET | Freq: Three times a day (TID) | ORAL | Status: DC

## 2015-11-06 MED ORDER — NITROFURANTOIN MONOHYD MACRO 100 MG PO CAPS: 100.0000 mg | ORAL_CAPSULE | Freq: Two times a day (BID) | ORAL | Status: DC

## 2015-11-06 NOTE — Telephone Encounter (Signed)
Noted. Will await ER eval.  

## 2015-11-06 NOTE — Discharge Instructions (Signed)
Please read and follow all provided instructions.  Your diagnoses today include:  1. Suprapubic abdominal pain, unspecified laterality     Tests performed today include:  Urine test - suggests that you have an infection in your bladder  Urine culture - pending to confirm infection  Vital signs. See below for your results today.   Medications prescribed:   Pyridium - medication for urinary tract infection symptoms.   This medication will turn your urine orange. This is normal.    Macrobid - antibiotic for urinary tract infection  You have been prescribed an antibiotic medicine: take the entire course of medicine even if you are feeling better. Stopping early can cause the antibiotic not to work.  Home care instructions:  Follow any educational materials contained in this packet.  Follow-up instructions: Please follow-up with your primary care provider in 3 days if symptoms are not resolved for further evaluation of your symptoms.  Return instructions:   Please return to the Emergency Department if you experience worsening symptoms.   Return with fever, worsening pain, persistent vomiting, worsening pain in your back.   Please return if you have any other emergent concerns.  Additional Information:  Your vital signs today were: BP 138/87 mmHg   Pulse 108   Temp(Src) 98.7 F (37.1 C) (Oral)   Resp 16   SpO2 97% If your blood pressure (BP) was elevated above 135/85 this visit, please have this repeated by your doctor within one month. --------------

## 2015-11-06 NOTE — ED Provider Notes (Signed)
CSN: [id number]     Arrival date & time 11/06/15  [id number] History   First MD Initiated Contact with Patient 11/06/15 1349     Chief Complaint  Patient presents with  . Abdominal Pain     (Consider location/radiation/quality/duration/timing/severity/associated sxs/prior Treatment) HPI Comments: Patient with h/o diverticulitis -- presents with complaint of suprapubic abdominal pain starting 2 days ago. Pain was mild and onset and gradually became worse. Patient had difficulty sleeping last night because of the pain. Patient notes that the pain is in the midline. It is worse with urination. No vaginal bleeding or discharge. No associated nausea, vomiting, diarrhea. No documented fevers. Patient has chronic back pain at baseline and states that her pain was somewhat more persistent over the weekend. No blood in stool or constipation. Patient states that she was unable to see her primary care physician today which prompted emergency department visit. The onset of this condition was acute. The course is constant. Aggravating factors: none. Alleviating factors: none.    Patient is a 45 y.o. female presenting with abdominal pain. The history is provided by the patient.  Abdominal Pain Associated symptoms: dysuria   Associated symptoms: no chest pain, no cough, no diarrhea, no fever, no hematuria, no nausea, no sore throat, no vaginal bleeding, no vaginal discharge and no vomiting     Past Medical History  Diagnosis Date  . Diverticulitis   . Migraines   . Premenstrual dysphoric disorder   . Allergy   . Breast fibroadenoma   . Migraines   . GERD (gastroesophageal reflux disease)   . Hypertension    Past Surgical History  Procedure Laterality Date  . Tonsillectomy    . Cervical fusion      C6-7  . Back surgery  3/07    c6-c7 fused   . Wisdom teeth removal   1996  . Breast biopsy Left 2013    negative  . Dilation and curettage of uterus  10/20/14  . Uterine ablation  5/16    Dr  De Burrs   Family History  Problem Relation Age of Onset  . Coronary artery disease Father   . Hypertension Father   . Colon cancer Maternal Uncle   . Colon cancer Maternal Grandmother   . Colon cancer Maternal Grandfather   . Colon cancer Paternal Grandmother   . Coronary artery disease Paternal Grandmother   . Colon cancer Paternal Grandfather   . Cancer Sister 45    breast--post-menopausal  . Breast cancer Sister 27   Social History  Substance Use Topics  . Smoking status: Never Smoker   . Smokeless tobacco: Never Used  . Alcohol Use: No   OB History    Gravida Para Term Preterm AB TAB SAB Ectopic Multiple Living   2 2        2       Obstetric Comments   First pregnancy 22 First menstrual 10     Review of Systems  Constitutional: Negative for fever.  HENT: Negative for rhinorrhea and sore throat.   Eyes: Negative for redness.  Respiratory: Negative for cough.   Cardiovascular: Negative for chest pain.  Gastrointestinal: Positive for abdominal pain. Negative for nausea, vomiting, diarrhea and blood in stool.  Genitourinary: Positive for dysuria. Negative for frequency, hematuria, vaginal bleeding and vaginal discharge.  Musculoskeletal: Negative for myalgias.  Skin: Negative for rash.  Neurological: Negative for headaches.    Allergies  Codeine sulfate  Home Medications   Prior to Admission medications   Medication Sig  Start Date End Date Taking? Authorizing Provider  butalbital-acetaminophen-caffeine (FIORICET, ESGIC) 50-325-40 MG per tablet Take 1 tablet by mouth every 4 (four) hours as needed for headache. 01/25/15   Venia Carbon, MD  cetirizine (ZYRTEC) 10 MG tablet Take 10 mg by mouth daily as needed.      Historical Provider, MD  losartan-hydrochlorothiazide (HYZAAR) 100-25 MG tablet TAKE ONE (1) TABLET EACH DAY 10/09/15   Venia Carbon, MD  Multiple Vitamin (MULTIVITAMIN) capsule Take 1 capsule by mouth daily.      Historical Provider, MD   omeprazole (PRILOSEC) 20 MG capsule Take 1 capsule (20 mg total) by mouth 2 (two) times daily before a meal. 01/25/15   Venia Carbon, MD  sertraline (ZOLOFT) 100 MG tablet Take 1 tablet (100 mg total) by mouth daily. 10/17/14   Venia Carbon, MD   BP 138/87 mmHg  Pulse 108  Temp(Src) 98.7 F (37.1 C) (Oral)  Resp 16  SpO2 97%   Physical Exam  Constitutional: She appears well-developed and well-nourished.  HENT:  Head: Normocephalic and atraumatic.  Eyes: Conjunctivae are normal. Right eye exhibits no discharge. Left eye exhibits no discharge.  Neck: Normal range of motion. Neck supple.  Cardiovascular: Normal rate, regular rhythm and normal heart sounds.   Pulmonary/Chest: Effort normal and breath sounds normal.  Abdominal: Soft. Bowel sounds are normal. She exhibits no distension. There is tenderness in the suprapubic area. There is no rigidity, no rebound, no guarding, no tenderness at McBurney's point and negative Murphy's sign.  Neurological: She is alert.  Skin: Skin is warm and dry.  Psychiatric: She has a normal mood and affect.  Nursing note and vitals reviewed.   ED Course  Procedures (including critical care time) Labs Review Labs Reviewed  URINALYSIS, ROUTINE W REFLEX MICROSCOPIC (NOT AT [facility]) - Abnormal; Notable for the following:    Color, Urine ORANGE (*)    APPearance CLOUDY (*)    Bilirubin Urine SMALL (*)    Ketones, ur 15 (*)    Nitrite POSITIVE (*)    Leukocytes, UA MODERATE (*)    All other components within normal limits  URINE MICROSCOPIC-ADD ON - Abnormal; Notable for the following:    Squamous Epithelial / LPF 6-30 (*)    Bacteria, UA FEW (*)    All other components within normal limits  URINE CULTURE    Imaging Review No results found. I have personally reviewed and evaluated these images and lab results as part of my medical decision-making.   EKG Interpretation None       2:02 PM Patient seen and examined. Reviewed US findings with  patient -- suggestive of UTI however not a lot of WBCs.   Vital signs reviewed and are as follows: BP 138/87 mmHg  Pulse 108  Temp(Src) 98.7 F (37.1 C) (Oral)  Resp 16  SpO2 97%  Patient does have history of diverticulitis approximately 13 years ago. I discussed with the patient and her husband that while exam and findings today likely represent UTI with positive nitrite, large leukocyte esterase -- diverticulitis cannot be entirely excluded at this time. I do not feel that patient warrants imaging or additional work-up at this time given her mild symptoms. However, she needs to follow-up with her doctor or return to the emergency department with worsening symptoms especially if pain moves to one side of the abdomen, becomes more severe, if she develops fever or vomiting, or blood in stools. Patient states that she will definitely follow up  with her doctor or return with these symptoms.  MDM   Final diagnoses:  Suprapubic abdominal pain, unspecified laterality   Patient with suprapubic abdominal pain, dysuria, UA with positive nitrite suggestive of uncomplicated cystitis. No additional symptoms to suggest pyelonephritis. Sigmoid diverticulitis cannot be entirely ruled out, however patient is nontoxic in appearance, afebrile, well-appearing. Patient is comfortable with treatment for UTI, and follow-up or return if symptoms do not improve or worsen.  Carlisle Cater, PA-C 11/06/15 1418  Julianne Rice, MD 11/10/15 [phone]

## 2015-11-06 NOTE — Telephone Encounter (Signed)
Patient Name: LYRIC HONNOLD DOB: [birth year] Initial Comment Caller states, wants an appt, bad abd pains all weekend, Severe Abd pains now Nurse Assessment Nurse: Ronnald Ramp, RN, Miranda Date/Time (Eastern Time): 11/06/2015 8:28:39 AM Confirm and document reason for call. If symptomatic, describe symptoms. You must click the next button to save text entered. ---Caller states she has had lower abdominal pain since Saturday that has been getting worse. Denies vomiting or diarrhea. Temp upto 100. Has the patient traveled out of the country within the last 30 days? ---No Does the patient have any new or worsening symptoms? ---Yes Will a triage be completed? ---Yes Related visit to physician within the last 2 weeks? ---No Does the PT have any chronic conditions? (i.e. diabetes, asthma, etc.) ---Yes List chronic conditions. ---HTN, Migraines, Is the patient pregnant or possibly pregnant? (Ask all females between the ages of 61-55) ---No Is this a behavioral health or substance abuse call? ---No Guidelines Guideline Title Affirmed Question Affirmed Notes Abdominal Pain - Female [1] SEVERE pain (e.g., excruciating) AND [2] present > 1 hour Final Disposition User Go to ED Now Ronnald Ramp, RN, Miranda Comments Caller is refusing the ED. She only wants an appt. Told caller that I would notify the provider of her request but could not guarantee when they would get the message or even be able to call her back. Called backline and spoke with Shirlean Mylar and told that urgent note being put in. Referrals GO TO FACILITY REFUSED Disagree/Comply: Comply

## 2015-11-06 NOTE — Telephone Encounter (Signed)
I spoke with pt and she started with lower middle abd pain on 11/04/15; when urinate abd hurts worse but does not have burning or pain upon urination but no frequency; pain level now is 5; pain comes and goes and worse pain level is 9. Pt does not think she has fever today; has not checked temp today. Pt is lethargic. Offered to look for pt an appt at another [facility] location but pt said the last time this happened she had to have a CT scan and Pt is going to go to [facility] ED now. Dr Silvio Pate out of office.

## 2015-11-06 NOTE — ED Notes (Signed)
Pt is in stable condition upon d/c and ambulates from ED. 

## 2015-11-06 NOTE — ED Notes (Signed)
Pt here for lower abd pain since Saturday. Denies any N,V,D. Denies vaginal bleeding or discharge. sts pain worse with urination and burning.

## 2015-11-07 ENCOUNTER — Encounter: Payer: Self-pay | Admitting: *Deleted

## 2015-11-07 NOTE — Telephone Encounter (Signed)
Seen at ER with dx UTI, started on nitrofurantoin

## 2015-11-08 LAB — URINE CULTURE: Culture: 8000

## 2015-11-09 ENCOUNTER — Telehealth: Payer: Self-pay

## 2015-11-09 NOTE — Telephone Encounter (Signed)
Left a message to call back to see how she is feeling after her ER visit for a UTI

## 2015-11-09 NOTE — Telephone Encounter (Signed)
Left message, again.

## 2015-11-09 NOTE — Telephone Encounter (Signed)
Patient returned [name]'s call.  Please call her back at [phone].

## 2015-11-13 NOTE — Telephone Encounter (Signed)
Spoke to patient. She said she is feeling much better. We looked at her urine culture.

## 2016-01-11 ENCOUNTER — Ambulatory Visit
Admission: RE | Admit: 2016-01-11 | Discharge: 2016-01-11 | Disposition: A | Discharge status: Home / Self Care | Payer: Managed Care, Other (non HMO) | Source: Ambulatory Visit | Attending: General Surgery | Admitting: General Surgery

## 2016-01-11 DIAGNOSIS — Z1231 Encounter for screening mammogram for malignant neoplasm of breast: Secondary | ICD-10-CM | POA: Insufficient documentation

## 2016-01-18 ENCOUNTER — Ambulatory Visit (INDEPENDENT_AMBULATORY_CARE_PROVIDER_SITE_OTHER): Payer: Managed Care, Other (non HMO) | Admitting: General Surgery

## 2016-01-18 ENCOUNTER — Encounter: Payer: Self-pay | Admitting: General Surgery

## 2016-01-18 VITALS — BP 162/72 | HR 94 | Resp 14 | Ht 64.0 in | Wt 167.0 lb

## 2016-01-18 DIAGNOSIS — Z803 Family history of malignant neoplasm of breast: Secondary | ICD-10-CM | POA: Diagnosis not present

## 2016-01-18 DIAGNOSIS — N6019 Diffuse cystic mastopathy of unspecified breast: Secondary | ICD-10-CM

## 2016-01-18 NOTE — Patient Instructions (Addendum)
The patient has been asked to return to the office in one year with a bilateral screening mammogram. Continue self breast exams. Call office for any new breast issues or concerns.  

## 2016-01-18 NOTE — Progress Notes (Signed)
Patient ID: [name], female   DOB: [birth year], 45 y.o.   MRN: [id number]  Chief Complaint  Patient presents with  . Follow-up    mamogram    HPI [name] is a 45 y.o. female who presents for a breast evaluation. The most recent mammogram was done on 01/12/16.  Patient does perform regular self breast checks and gets regular mammograms done.   I have reviewed the history of present illness with the patient.   HPI  Past Medical History  Diagnosis Date  . Diverticulitis   . Migraines   . Premenstrual dysphoric disorder   . Allergy   . Breast fibroadenoma   . Migraines   . GERD (gastroesophageal reflux disease)   . Hypertension     Past Surgical History  Procedure Laterality Date  . Tonsillectomy    . Cervical fusion      C6-7  . Back surgery  3/07    c6-c7 fused   . Wisdom teeth removal   1996  . Dilation and curettage of uterus  10/20/14  . Uterine ablation  5/16    Dr De Burrs  . Breast biopsy Left 2013    negative with clip  . Breast excisional biopsy Bilateral     benign    Family History  Problem Relation Age of Onset  . Coronary artery disease Father   . Hypertension Father   . Colon cancer Maternal Uncle   . Colon cancer Maternal Grandmother   . Colon cancer Maternal Grandmother   . Breast cancer Sister   . Coronary artery disease Paternal Grandmother   . Colon cancer Paternal Grandfather     Social History Social History  Substance Use Topics  . Smoking status: Never Smoker   . Smokeless tobacco: Never Used  . Alcohol Use: No    Allergies  Allergen Reactions  . Codeine Sulfate     REACTION: Nausea    Current Outpatient Prescriptions  Medication Sig Dispense Refill  . butalbital-acetaminophen-caffeine (FIORICET, ESGIC) 50-325-40 MG per tablet Take 1 tablet by mouth every 4 (four) hours as needed for headache. 30 tablet 0  . cetirizine (ZYRTEC) 10 MG tablet Take 10 mg by mouth daily as needed.      [name]  losartan-hydrochlorothiazide (HYZAAR) 100-25 MG tablet TAKE ONE (1) TABLET EACH DAY 90 tablet 3  . Multiple Vitamin (MULTIVITAMIN) capsule Take 1 capsule by mouth daily.      [name] omeprazole (PRILOSEC) 20 MG capsule Take 1 capsule (20 mg total) by mouth 2 (two) times daily before a meal. 60 capsule 11   No current facility-administered medications for this visit.    Review of Systems Review of Systems  Constitutional: Negative.   Respiratory: Negative.   Cardiovascular: Negative.     Blood pressure 162/72, pulse 94, resp. rate 14, height 5\' 4"  (1.626 m), weight 167 lb (75.751 kg).  Physical Exam Physical Exam  Constitutional: She is oriented to person, place, and time. She appears well-developed and well-nourished.  Eyes: Conjunctivae are normal. No scleral icterus.  Neck: Neck supple.  Cardiovascular: Normal rate, regular rhythm and normal heart sounds.   Pulmonary/Chest: Effort normal and breath sounds normal. Right breast exhibits no inverted nipple, no mass, no nipple discharge, no skin change and no tenderness. Left breast exhibits no inverted nipple, no mass, no nipple discharge, no skin change and no tenderness.  Left breat mild thickness inferior fold.No defined mass  Abdominal: Soft. Bowel sounds are normal. There is no tenderness.  Lymphadenopathy:  She has no cervical adenopathy.    She has no axillary adenopathy.  Neurological: She is alert and oriented to person, place, and time.  Skin: Skin is warm and dry.    Data Reviewed Mammogram reviewed   Assessment    FCD. FH of breast cancer. Stable exam. Pt still has not decided on genetic testing     Plan    The patient has been asked to return to the office in one year with a bilateral screening mammogram.     PCP:  Viviana Simpler I This information has been scribed by Gaspar Cola CMA.    Deanna Wiater G 01/21/2016, 10:38 AM

## 2016-01-21 ENCOUNTER — Encounter: Payer: Self-pay | Admitting: General Surgery

## 2016-01-31 ENCOUNTER — Other Ambulatory Visit (HOSPITAL_COMMUNITY)
Admission: RE | Admit: 2016-01-31 | Discharge: 2016-01-31 | Disposition: A | Discharge status: Home / Self Care | Payer: Managed Care, Other (non HMO) | Source: Ambulatory Visit | Attending: Internal Medicine | Admitting: Internal Medicine

## 2016-01-31 ENCOUNTER — Ambulatory Visit (INDEPENDENT_AMBULATORY_CARE_PROVIDER_SITE_OTHER): Payer: Managed Care, Other (non HMO) | Admitting: Internal Medicine

## 2016-01-31 ENCOUNTER — Encounter: Payer: Self-pay | Admitting: Internal Medicine

## 2016-01-31 VITALS — BP 116/80 | HR 82 | Temp 98.2°F | Ht 64.5 in | Wt 163.0 lb

## 2016-01-31 DIAGNOSIS — I10 Essential (primary) hypertension: Secondary | ICD-10-CM | POA: Diagnosis not present

## 2016-01-31 DIAGNOSIS — Z0000 Encounter for general adult medical examination without abnormal findings: Secondary | ICD-10-CM | POA: Diagnosis not present

## 2016-01-31 DIAGNOSIS — Z01419 Encounter for gynecological examination (general) (routine) without abnormal findings: Secondary | ICD-10-CM | POA: Diagnosis present

## 2016-01-31 DIAGNOSIS — K2100 Gastro-esophageal reflux disease with esophagitis, without bleeding: Secondary | ICD-10-CM

## 2016-01-31 DIAGNOSIS — F39 Unspecified mood [affective] disorder: Secondary | ICD-10-CM | POA: Diagnosis not present

## 2016-01-31 DIAGNOSIS — Z1151 Encounter for screening for human papillomavirus (HPV): Secondary | ICD-10-CM | POA: Diagnosis not present

## 2016-01-31 LAB — CBC WITH DIFFERENTIAL/PLATELET
BASOS ABS: 0 10*3/uL (ref 0.0–0.1)
Basophils Relative: 0.4 % (ref 0.0–3.0)
EOS PCT: 0.7 % (ref 0.0–5.0)
Eosinophils Absolute: 0 10*3/uL (ref 0.0–0.7)
HEMATOCRIT: 39.8 % (ref 36.0–46.0)
HEMOGLOBIN: 13.4 g/dL (ref 12.0–15.0)
LYMPHS PCT: 31.6 % (ref 12.0–46.0)
Lymphs Abs: 1.7 10*3/uL (ref 0.7–4.0)
MCHC: 33.6 g/dL (ref 30.0–36.0)
MCV: 81.7 fl (ref 78.0–100.0)
MONOS PCT: 8.2 % (ref 3.0–12.0)
Monocytes Absolute: 0.5 10*3/uL (ref 0.1–1.0)
NEUTROS ABS: 3.2 10*3/uL (ref 1.4–7.7)
Neutrophils Relative %: 59.1 % (ref 43.0–77.0)
Platelets: 319 10*3/uL (ref 150.0–400.0)
RBC: 4.87 Mil/uL (ref 3.87–5.11)
RDW: 13.2 % (ref 11.5–15.5)
WBC: 5.5 10*3/uL (ref 4.0–10.5)

## 2016-01-31 LAB — COMPREHENSIVE METABOLIC PANEL
ALT: 24 U/L (ref 0–35)
AST: 21 U/L (ref 0–37)
Albumin: 3.9 g/dL (ref 3.5–5.2)
Alkaline Phosphatase: 98 U/L (ref 39–117)
BUN: 18 mg/dL (ref 6–23)
CALCIUM: 9.2 mg/dL (ref 8.4–10.5)
CHLORIDE: 101 meq/L (ref 96–112)
CO2: 32 meq/L (ref 19–32)
CREATININE: 0.8 mg/dL (ref 0.40–1.20)
GFR: 81.78 mL/min (ref >60.00)
Glucose, Bld: 85 mg/dL (ref 70–99)
POTASSIUM: 3.6 meq/L (ref 3.5–5.1)
Sodium: 140 mEq/L (ref 135–145)
Total Bilirubin: 0.4 mg/dL (ref 0.2–1.2)
Total Protein: 7.1 g/dL (ref 6.0–8.3)

## 2016-01-31 LAB — T4, FREE: Free T4: 0.79 ng/dL (ref 0.60–1.60)

## 2016-01-31 MED ORDER — SERTRALINE HCL 50 MG PO TABS: 50.0000 mg | ORAL_TABLET | Freq: Every day | ORAL | Status: DC

## 2016-01-31 NOTE — Addendum Note (Signed)
Addended by: Daralene Milch C on: 01/31/2016 02:06 PM   Modules accepted: Miquel Dunn

## 2016-01-31 NOTE — Progress Notes (Signed)
Subjective:    Patient ID: [name], female    DOB: May 29, 1971, 45 y.o.   MRN: [id number]  HPI Here for physical  Doing well Weaned off the sertraline but found her mood has worsened Okay at work but not the same at work or at home 2 weeks a month are especially bad--coincides with ovulation Only some spotting since ablation--more lately  Having more trouble with her back Radiating pains at times Occasional numbness in left foot Trouble with her workouts--- but still goes to gym  Recent breast exam and mammo This was good  Saw GI about her dysphagia-- Dr Elliott's PA No EGD-- better on the PPI. Had barium swallow Had laryngoscopy by ENT which was okay This is better  Current Outpatient Prescriptions on File Prior to Visit  Medication Sig Dispense Refill  . butalbital-acetaminophen-caffeine (FIORICET, ESGIC) 50-325-40 MG per tablet Take 1 tablet by mouth every 4 (four) hours as needed for headache. 30 tablet 0  . cetirizine (ZYRTEC) 10 MG tablet Take 10 mg by mouth daily as needed.      Marland Kitchen losartan-hydrochlorothiazide (HYZAAR) 100-25 MG tablet TAKE ONE (1) TABLET EACH DAY 90 tablet 3  . Multiple Vitamin (MULTIVITAMIN) capsule Take 1 capsule by mouth daily.      Marland Kitchen omeprazole (PRILOSEC) 20 MG capsule Take 1 capsule (20 mg total) by mouth 2 (two) times daily before a meal. 60 capsule 11   No current facility-administered medications on file prior to visit.    Allergies  Allergen Reactions  . Codeine Sulfate     REACTION: Nausea    Past Medical History  Diagnosis Date  . Diverticulitis   . Migraines   . Premenstrual dysphoric disorder   . Allergy   . Breast fibroadenoma   . Migraines   . GERD (gastroesophageal reflux disease)   . Hypertension     Past Surgical History  Procedure Laterality Date  . Tonsillectomy    . Cervical fusion      C6-7  . Back surgery  3/07    c6-c7 fused   . Wisdom teeth removal   1996  . Dilation and curettage of uterus  10/20/14    . Uterine ablation  5/16    Dr De Burrs  . Breast biopsy Left 2013    negative with clip  . Breast excisional biopsy Bilateral     benign    Family History  Problem Relation Age of Onset  . Coronary artery disease Father   . Hypertension Father   . Colon cancer Maternal Uncle   . Colon cancer Maternal Grandmother   . Colon cancer Maternal Grandmother   . Breast cancer Sister   . Coronary artery disease Paternal Grandmother   . Colon cancer Paternal Grandfather     Social History   Social History  . Marital Status: Married    Spouse Name: N/A  . Number of Children: 2  . Years of Education: N/A   Occupational History  .  Hospice nurse   .      Social History Main Topics  . Smoking status: Never Smoker   . Smokeless tobacco: Never Used  . Alcohol Use: No  . Drug Use: No  . Sexual Activity: Not on file   Other Topics Concern  . Not on file   Social History Narrative   Divorced 2012--- remarried 2015   Review of Systems  Constitutional: Negative for fatigue and unexpected weight change.       Wears seat  belt  HENT: Negative for dental problem, hearing loss, tinnitus and trouble swallowing.        Keeps up with dentist  Eyes: Negative for visual disturbance.       No diplopia or unilateral vision loss Needs allergy drops every day  Respiratory: Negative for cough, chest tightness and shortness of breath.   Cardiovascular: Negative for chest pain and leg swelling.       Rare palpitations at rest  Gastrointestinal: Negative for nausea, vomiting and abdominal pain.  Endocrine: Negative for polydipsia and polyuria.  Genitourinary: Negative for dysuria, hematuria and dyspareunia.  Musculoskeletal: Positive for back pain. Negative for joint swelling and arthralgias.  Skin: Negative for rash.       No suspicious lesions  Allergic/Immunologic: Positive for environmental allergies. Negative for immunocompromised state.  Neurological: Positive for dizziness  and headaches. Negative for syncope and light-headedness.       Bad spell of dizziness--getting out of bed (vestibular) Headaches are better  Hematological: Negative for adenopathy. Does not bruise/bleed easily.  Psychiatric/Behavioral: Negative for sleep disturbance and dysphoric mood.       Just irritable--not depressed       Objective:   Physical Exam  Constitutional: She is oriented to person, place, and time. She appears well-developed and well-nourished. No distress.  HENT:  Head: Normocephalic and atraumatic.  Right Ear: External ear normal.  Left Ear: External ear normal.  Mouth/Throat: Oropharynx is clear and moist. No oropharyngeal exudate.  Eyes: Conjunctivae are normal. Pupils are equal, round, and reactive to light.  Neck: Normal range of motion. Neck supple. No thyromegaly present.  Cardiovascular: Normal rate, regular rhythm, normal heart sounds and intact distal pulses.  Exam reveals no gallop.   No murmur heard. Pulmonary/Chest: Effort normal and breath sounds normal. No respiratory distress. She has no wheezes. She has no rales.  Abdominal: Soft. There is no tenderness.  Genitourinary:  Normal introitus No apparent vaginal lesions Cervix appears normal--pap done (just some blood at opening)   Musculoskeletal: She exhibits no edema or tenderness.  Lymphadenopathy:    She has no cervical adenopathy.  Neurological: She is alert and oriented to person, place, and time.  Skin: No rash noted. No erythema.  Psychiatric: She has a normal mood and affect. Her behavior is normal.          Assessment & Plan:

## 2016-01-31 NOTE — Assessment & Plan Note (Signed)
BP Readings from Last 3 Encounters:  01/31/16 116/80  01/18/16 162/72  11/06/15 138/87   Good control Will check labs

## 2016-01-31 NOTE — Assessment & Plan Note (Signed)
Irritability has returned off the sertraline Will restart

## 2016-01-31 NOTE — Assessment & Plan Note (Signed)
Healthy Working on fitness Yearly flu shot Will probably get colon soon due to FH---has GI follow up

## 2016-01-31 NOTE — Assessment & Plan Note (Signed)
Better on the PPI

## 2016-01-31 NOTE — Progress Notes (Signed)
Pre visit review using our clinic review tool, if applicable. No additional management support is needed unless otherwise documented below in the visit note. 

## 2016-01-31 NOTE — Addendum Note (Signed)
Addended by: Pilar Grammes on: 01/31/2016 10:05 AM   Modules accepted: Orders, SmartSet

## 2016-02-01 LAB — CYTOLOGY - PAP

## 2016-02-04 ENCOUNTER — Other Ambulatory Visit: Payer: Self-pay | Admitting: Internal Medicine

## 2016-02-08 ENCOUNTER — Encounter: Payer: Self-pay | Admitting: Internal Medicine

## 2016-06-09 ENCOUNTER — Encounter: Payer: Self-pay | Admitting: Internal Medicine

## 2016-06-10 ENCOUNTER — Encounter: Payer: Self-pay | Admitting: Internal Medicine

## 2016-06-10 ENCOUNTER — Ambulatory Visit (INDEPENDENT_AMBULATORY_CARE_PROVIDER_SITE_OTHER): Payer: Managed Care, Other (non HMO) | Admitting: Internal Medicine

## 2016-06-10 VITALS — BP 128/86 | HR 70 | Temp 98.5°F | Wt 166.0 lb

## 2016-06-10 DIAGNOSIS — Z23 Encounter for immunization: Secondary | ICD-10-CM | POA: Diagnosis not present

## 2016-06-10 DIAGNOSIS — N3000 Acute cystitis without hematuria: Secondary | ICD-10-CM | POA: Insufficient documentation

## 2016-06-10 LAB — POC URINALSYSI DIPSTICK (AUTOMATED)
Bilirubin, UA: NEGATIVE
Blood, UA: NEGATIVE
Glucose, UA: NEGATIVE
Ketones, UA: NEGATIVE
NITRITE UA: NEGATIVE
PH UA: 6
PROTEIN UA: NEGATIVE
Spec Grav, UA: 1.025
Urobilinogen, UA: 0.2

## 2016-06-10 MED ORDER — NITROFURANTOIN MACROCRYSTAL 100 MG PO CAPS: 100.0000 mg | ORAL_CAPSULE | Freq: Two times a day (BID) | ORAL | 0 refills | Status: DC

## 2016-06-10 NOTE — Assessment & Plan Note (Signed)
Classic presentation Mild urinalysis abnormalities Will treat with macrodantin again---hopefully only 3 days.

## 2016-06-10 NOTE — Progress Notes (Signed)
   Subjective:    Patient ID: [name], female    DOB: [birth year], 45 y.o.   MRN: [id number]  HPI Here due to urinary symptoms  Having some mild urinary symptoms Flying to [place] to visit friends tomorrow  Having suprapubic pain--spasms Worse when passing urine Slight burning dysuria No sig increased frequency or urgency No hematuria  Current Outpatient Prescriptions on File Prior to Visit  Medication Sig Dispense Refill  . butalbital-acetaminophen-caffeine (FIORICET, ESGIC) 50-325-40 MG per tablet Take 1 tablet by mouth every 4 (four) hours as needed for headache. 30 tablet 0  . cetirizine (ZYRTEC) 10 MG tablet Take 10 mg by mouth daily as needed.      Marland Kitchen losartan-hydrochlorothiazide (HYZAAR) 100-25 MG tablet TAKE ONE (1) TABLET EACH DAY 90 tablet 3  . Multiple Vitamin (MULTIVITAMIN) capsule Take 1 capsule by mouth daily.      Marland Kitchen omeprazole (PRILOSEC) 20 MG capsule TAKE ONE (1) CAPSULE BY MOUTH 2 TIMES DAILY BEFORE MEALS. 60 capsule 11  . sertraline (ZOLOFT) 50 MG tablet Take 1 tablet (50 mg total) by mouth daily. 90 tablet 3   No current facility-administered medications on file prior to visit.     Allergies  Allergen Reactions  . Codeine Sulfate     REACTION: Nausea    Past Medical History:  Diagnosis Date  . Allergy   . Breast fibroadenoma   . Diverticulitis   . GERD (gastroesophageal reflux disease)   . Hypertension   . Migraines   . Migraines   . Premenstrual dysphoric disorder     Past Surgical History:  Procedure Laterality Date  . BACK SURGERY  3/07   c6-c7 fused   . BREAST BIOPSY Left 2013   negative with clip  . BREAST EXCISIONAL BIOPSY Bilateral    benign  . CERVICAL FUSION     C6-7  . DILATION AND CURETTAGE OF UTERUS  10/20/14  . TONSILLECTOMY    . Uterine ablation  5/16   Dr De Burrs  . wisdom teeth removal   1996    Family History  Problem Relation Age of Onset  . Coronary artery disease Father   . Hypertension Father   . Colon  cancer Maternal Uncle   . Colon cancer Maternal Grandmother   . Colon cancer Maternal Grandmother   . Breast cancer Sister   . Coronary artery disease Paternal Grandmother   . Colon cancer Paternal Grandfather     Social History   Social History  . Marital status: Married    Spouse name: N/A  . Number of children: 2  . Years of education: N/A   Occupational History  .  Hospice nurse   .      Social History Main Topics  . Smoking status: Never Smoker  . Smokeless tobacco: Never Used  . Alcohol use No  . Drug use: No  . Sexual activity: Not on file   Other Topics Concern  . Not on file   Social History Narrative   Divorced 2012--- remarried 2015   Review of Systems Fleeting nausea but no vomiting Eating okay No fever    Objective:   Physical Exam  Constitutional: She appears well-developed and well-nourished. No distress.  Abdominal:  Mild suprapubic tenderness  Musculoskeletal:  No CVA tenderness          Assessment & Plan:

## 2016-06-10 NOTE — Addendum Note (Signed)
Addended by: Pilar Grammes on: 06/10/2016 11:43 AM   Modules accepted: Orders

## 2016-06-10 NOTE — Patient Instructions (Signed)
Please start the antibiotic and take 2 doses today. If your symptoms are better by tomorrow, you can stop after 3 days.

## 2016-06-10 NOTE — Progress Notes (Signed)
Pre visit review using our clinic review tool, if applicable. No additional management support is needed unless otherwise documented below in the visit note. 

## 2016-07-17 ENCOUNTER — Ambulatory Visit (INDEPENDENT_AMBULATORY_CARE_PROVIDER_SITE_OTHER): Payer: Managed Care, Other (non HMO) | Admitting: Internal Medicine

## 2016-07-17 ENCOUNTER — Encounter: Payer: Self-pay | Admitting: Internal Medicine

## 2016-07-17 VITALS — BP 122/80 | HR 77 | Temp 98.3°F | Wt 168.0 lb

## 2016-07-17 DIAGNOSIS — H6191 Disorder of right external ear, unspecified: Secondary | ICD-10-CM | POA: Insufficient documentation

## 2016-07-17 DIAGNOSIS — L989 Disorder of the skin and subcutaneous tissue, unspecified: Secondary | ICD-10-CM

## 2016-07-17 MED ORDER — TRIAMCINOLONE ACETONIDE 0.1 % EX CREA: 1.0000 "application " | TOPICAL_CREAM | Freq: Two times a day (BID) | CUTANEOUS | 1 refills | Status: DC | PRN

## 2016-07-17 NOTE — Progress Notes (Signed)
Subjective:    Patient ID: [name], female    DOB: [birth year], 45 y.o.   MRN: [id number]  HPI Here due to itching behind her ear  Little knot behind the right ear Terrible itching for 2 weeks -- ,mostly behind the ear and lower pinna No clear rash or infection  No new hair products No dandruff Doesn't wear glasses  Tried benedryl tablets and cream 1% hydrocortisone cream All no help (just slight brief relief)  Current Outpatient Prescriptions on File Prior to Visit  Medication Sig Dispense Refill  . butalbital-acetaminophen-caffeine (FIORICET, ESGIC) 50-325-40 MG per tablet Take 1 tablet by mouth every 4 (four) hours as needed for headache. 30 tablet 0  . cetirizine (ZYRTEC) 10 MG tablet Take 10 mg by mouth daily as needed.      Marland Kitchen losartan-hydrochlorothiazide (HYZAAR) 100-25 MG tablet TAKE ONE (1) TABLET EACH DAY 90 tablet 3  . Multiple Vitamin (MULTIVITAMIN) capsule Take 1 capsule by mouth daily.      Marland Kitchen omeprazole (PRILOSEC) 20 MG capsule TAKE ONE (1) CAPSULE BY MOUTH 2 TIMES DAILY BEFORE MEALS. 60 capsule 11  . sertraline (ZOLOFT) 50 MG tablet Take 1 tablet (50 mg total) by mouth daily. 90 tablet 3   No current facility-administered medications on file prior to visit.     Allergies  Allergen Reactions  . Codeine Sulfate     REACTION: Nausea    Past Medical History:  Diagnosis Date  . Allergy   . Breast fibroadenoma   . Diverticulitis   . GERD (gastroesophageal reflux disease)   . Hypertension   . Migraines   . Migraines   . Premenstrual dysphoric disorder     Past Surgical History:  Procedure Laterality Date  . BACK SURGERY  3/07   c6-c7 fused   . BREAST BIOPSY Left 2013   negative with clip  . BREAST EXCISIONAL BIOPSY Bilateral    benign  . CERVICAL FUSION     C6-7  . DILATION AND CURETTAGE OF UTERUS  10/20/14  . TONSILLECTOMY    . Uterine ablation  5/16   Dr De Burrs  . wisdom teeth removal   1996    Family History  Problem  Relation Age of Onset  . Coronary artery disease Father   . Hypertension Father   . Colon cancer Maternal Uncle   . Colon cancer Maternal Grandmother   . Colon cancer Maternal Grandmother   . Breast cancer Sister   . Coronary artery disease Paternal Grandmother   . Colon cancer Paternal Grandfather     Social History   Social History  . Marital status: Married    Spouse name: N/A  . Number of children: 2  . Years of education: N/A   Occupational History  .  Hospice nurse   .      Social History Main Topics  . Smoking status: Never Smoker  . Smokeless tobacco: Never Used  . Alcohol use No  . Drug use: No  . Sexual activity: Not on file   Other Topics Concern  . Not on file   Social History Narrative   Divorced 2012--- remarried 2015   Review of Systems  Not sick No fever     Objective:   Physical Exam  Constitutional: She appears well-nourished. No distress.  Skin:  ~65mm irregular lesion with clear borders in lower right postauricular area. Mild inflammation along entire back of pinna around this lesion (but the lesion is not inflamed)  No scalp rash/scaling  Assessment & Plan:

## 2016-07-17 NOTE — Progress Notes (Signed)
Pre visit review using our clinic review tool, if applicable. No additional management support is needed unless otherwise documented below in the visit note. 

## 2016-07-17 NOTE — Assessment & Plan Note (Signed)
This is suspicious but may just be reactive Bad spot for liquid nitrogen and may need biopsy Will give TAC for the inflammation---if the lesion remains, she will see her dermatologist ([place])

## 2016-07-17 NOTE — Patient Instructions (Addendum)
Try the prescription cortisone cream. If the knot doesn't go away within 1-2 weeks with this, have it checked by your dermatologist

## 2016-08-02 ENCOUNTER — Ambulatory Visit (INDEPENDENT_AMBULATORY_CARE_PROVIDER_SITE_OTHER): Payer: Managed Care, Other (non HMO) | Admitting: Internal Medicine

## 2016-08-02 ENCOUNTER — Encounter: Payer: Self-pay | Admitting: Internal Medicine

## 2016-08-02 VITALS — BP 128/86 | HR 72 | Temp 98.5°F | Wt 169.0 lb

## 2016-08-02 DIAGNOSIS — N3000 Acute cystitis without hematuria: Secondary | ICD-10-CM | POA: Diagnosis not present

## 2016-08-02 LAB — POC URINALSYSI DIPSTICK (AUTOMATED)
Bilirubin, UA: NEGATIVE
Blood, UA: NEGATIVE
Glucose, UA: NEGATIVE
KETONES UA: NEGATIVE
LEUKOCYTES UA: NEGATIVE
Nitrite, UA: NEGATIVE
PH UA: 6
Protein, UA: NEGATIVE
Urobilinogen, UA: 0.2

## 2016-08-02 MED ORDER — SULFAMETHOXAZOLE-TRIMETHOPRIM 800-160 MG PO TABS: 1.0000 | ORAL_TABLET | Freq: Two times a day (BID) | ORAL | 2 refills | Status: DC

## 2016-08-02 NOTE — Progress Notes (Signed)
Pre visit review using our clinic review tool, if applicable. No additional management support is needed unless otherwise documented below in the visit note. 

## 2016-08-02 NOTE — Addendum Note (Signed)
Addended by: Pilar Grammes on: 08/02/2016 01:09 PM   Modules accepted: Orders

## 2016-08-02 NOTE — Patient Instructions (Signed)
Please let me know if these continue to happen, I will set you up with a urologist.

## 2016-08-02 NOTE — Assessment & Plan Note (Signed)
Recurrent now No obvious reason Will change to septra Try 1 after sex when better If persists, will set up with urology

## 2016-08-02 NOTE — Progress Notes (Signed)
Subjective:    Patient ID: [name], female    DOB: [birth year], 45 y.o.   MRN: [id number]  HPI Here due to urinary symptoms again Concerned about what is causing this  Only just started this year Wonders what is going on Did take the macrodantin the last time--but it recurred soon after the 3 days Took the rest and then better till now  No fever Not sick  Periods mostly gone No vaginal changes (not postmenopausal) No apparent relationship to sex  Not much help with azo the last time  Current Outpatient Prescriptions on File Prior to Visit  Medication Sig Dispense Refill  . butalbital-acetaminophen-caffeine (FIORICET, ESGIC) 50-325-40 MG per tablet Take 1 tablet by mouth every 4 (four) hours as needed for headache. 30 tablet 0  . cetirizine (ZYRTEC) 10 MG tablet Take 10 mg by mouth daily as needed.      Marland Kitchen losartan-hydrochlorothiazide (HYZAAR) 100-25 MG tablet TAKE ONE (1) TABLET EACH DAY 90 tablet 3  . Multiple Vitamin (MULTIVITAMIN) capsule Take 1 capsule by mouth daily.      Marland Kitchen omeprazole (PRILOSEC) 20 MG capsule TAKE ONE (1) CAPSULE BY MOUTH 2 TIMES DAILY BEFORE MEALS. 60 capsule 11  . sertraline (ZOLOFT) 50 MG tablet Take 1 tablet (50 mg total) by mouth daily. 90 tablet 3  . triamcinolone cream (KENALOG) 0.1 % Apply 1 application topically 2 (two) times daily as needed. 45 g 1   No current facility-administered medications on file prior to visit.     Allergies  Allergen Reactions  . Codeine Sulfate     REACTION: Nausea    Past Medical History:  Diagnosis Date  . Allergy   . Breast fibroadenoma   . Diverticulitis   . GERD (gastroesophageal reflux disease)   . Hypertension   . Migraines   . Migraines   . Premenstrual dysphoric disorder     Past Surgical History:  Procedure Laterality Date  . BACK SURGERY  3/07   c6-c7 fused   . BREAST BIOPSY Left 2013   negative with clip  . BREAST EXCISIONAL BIOPSY Bilateral    benign  . CERVICAL FUSION     C6-7    . DILATION AND CURETTAGE OF UTERUS  10/20/14  . TONSILLECTOMY    . Uterine ablation  5/16   Dr De Burrs  . wisdom teeth removal   1996    Family History  Problem Relation Age of Onset  . Coronary artery disease Father   . Hypertension Father   . Colon cancer Maternal Uncle   . Colon cancer Maternal Grandmother   . Colon cancer Maternal Grandmother   . Breast cancer Sister   . Coronary artery disease Paternal Grandmother   . Colon cancer Paternal Grandfather     Social History   Social History  . Marital status: Married    Spouse name: N/A  . Number of children: 2  . Years of education: N/A   Occupational History  .  Hospice nurse   .      Social History Main Topics  . Smoking status: Never Smoker  . Smokeless tobacco: Never Used  . Alcohol use No  . Drug use: No  . Sexual activity: Not on file   Other Topics Concern  . Not on file   Social History Narrative   Divorced 2012--- remarried 2015   Review of Systems  No nausea or vomiting Appetite is fine Bowels are regular     Objective:   Physical  Exam  Constitutional: She appears well-nourished. No distress.  Abdominal: Soft. She exhibits no distension. There is no tenderness. There is no rebound and no guarding.  Musculoskeletal:  No CVA tenderness          Assessment & Plan:

## 2016-08-22 ENCOUNTER — Other Ambulatory Visit: Payer: Self-pay | Admitting: Internal Medicine

## 2016-08-22 NOTE — Telephone Encounter (Signed)
Approved: 30 x 0 

## 2016-08-22 NOTE — Telephone Encounter (Signed)
Left refill on voice mail at pharmacy  

## 2016-08-22 NOTE — Telephone Encounter (Signed)
Last filled 01-25-15 #30 Last OV 08-02-16 Next OV 02-04-17

## 2016-09-09 ENCOUNTER — Other Ambulatory Visit: Payer: Self-pay | Admitting: Internal Medicine

## 2016-09-30 ENCOUNTER — Ambulatory Visit (INDEPENDENT_AMBULATORY_CARE_PROVIDER_SITE_OTHER): Payer: Managed Care, Other (non HMO) | Admitting: Internal Medicine

## 2016-09-30 ENCOUNTER — Encounter: Payer: Self-pay | Admitting: Internal Medicine

## 2016-09-30 VITALS — BP 122/88 | HR 101 | Temp 99.0°F | Wt 169.0 lb

## 2016-09-30 DIAGNOSIS — K529 Noninfective gastroenteritis and colitis, unspecified: Secondary | ICD-10-CM | POA: Diagnosis not present

## 2016-09-30 NOTE — Progress Notes (Signed)
Pre visit review using our clinic review tool, if applicable. No additional management support is needed unless otherwise documented below in the visit note. 

## 2016-09-30 NOTE — Progress Notes (Signed)
Subjective:    Patient ID: [name], female    DOB: May 31, 1971, 46 y.o.   MRN: [id number]  HPI Here due to systemic symptoms With husband  Started this morning Wondering if she had some reaction to pyridium Got sudden sick feeling about 20 minutes later Shaking and felt cold Then vomiting--for a while Then got in bed--chills better Slept for a while Now with headache  Doesn't think she had fever No sore throat, ear pain, cough  No other Rx  Current Outpatient Prescriptions on File Prior to Visit  Medication Sig Dispense Refill  . butalbital-acetaminophen-caffeine (FIORICET, ESGIC) 50-325-40 MG tablet TAKE ONE TABLET BY MOUTH EVERY 4 HOURS AS NEEDED FOR HEADACHE 30 tablet 0  . cetirizine (ZYRTEC) 10 MG tablet Take 10 mg by mouth daily as needed.      Marland Kitchen losartan-hydrochlorothiazide (HYZAAR) 100-25 MG tablet TAKE ONE (1) TABLET BY MOUTH EVERY DAY 90 tablet 1  . Multiple Vitamin (MULTIVITAMIN) capsule Take 1 capsule by mouth daily.      Marland Kitchen omeprazole (PRILOSEC) 20 MG capsule TAKE ONE (1) CAPSULE BY MOUTH 2 TIMES DAILY BEFORE MEALS. 60 capsule 11  . sertraline (ZOLOFT) 50 MG tablet Take 1 tablet (50 mg total) by mouth daily. 90 tablet 3  . sulfamethoxazole-trimethoprim (BACTRIM DS,SEPTRA DS) 800-160 MG tablet Take 1 tablet by mouth 2 (two) times daily. 20 tablet 2  . triamcinolone cream (KENALOG) 0.1 % Apply 1 application topically 2 (two) times daily as needed. 45 g 1   No current facility-administered medications on file prior to visit.     Allergies  Allergen Reactions  . Codeine Sulfate     REACTION: Nausea    Past Medical History:  Diagnosis Date  . Allergy   . Breast fibroadenoma   . Diverticulitis   . GERD (gastroesophageal reflux disease)   . Hypertension   . Migraines   . Migraines   . Premenstrual dysphoric disorder     Past Surgical History:  Procedure Laterality Date  . BACK SURGERY  3/07   c6-c7 fused   . BREAST BIOPSY Left 2013   negative with  clip  . BREAST EXCISIONAL BIOPSY Bilateral    benign  . CERVICAL FUSION     C6-7  . DILATION AND CURETTAGE OF UTERUS  10/20/14  . TONSILLECTOMY    . Uterine ablation  5/16   Dr De Burrs  . wisdom teeth removal   1996    Family History  Problem Relation Age of Onset  . Coronary artery disease Father   . Hypertension Father   . Colon cancer Maternal Uncle   . Colon cancer Maternal Grandmother   . Colon cancer Maternal Grandmother   . Breast cancer Sister   . Coronary artery disease Paternal Grandmother   . Colon cancer Paternal Grandfather     Social History   Social History  . Marital status: Married    Spouse name: N/A  . Number of children: 2  . Years of education: N/A   Occupational History  .  Hospice nurse   .      Social History Main Topics  . Smoking status: Never Smoker  . Smokeless tobacco: Never Used  . Alcohol use No  . Drug use: No  . Sexual activity: Not on file   Other Topics Concern  . Not on file   Social History Narrative   Divorced 2012--- remarried 2015   Review of Systems Appetite is off today Some loose stools 3-4 per  day for 2 days No known exposures to illness    Objective:   Physical Exam  Constitutional:  Slightly washed out but no distress Doesn't appear particularly ill  HENT:  Mouth/Throat: Oropharynx is clear and moist. No oropharyngeal exudate.  Neck: Normal range of motion. Neck supple. No thyromegaly present.  Pulmonary/Chest: Effort normal and breath sounds normal. No respiratory distress. She has no wheezes. She has no rales.  Abdominal: Soft. She exhibits no distension. There is no tenderness. There is no rebound and no guarding.  Slightly sore but not tender  Lymphadenopathy:    She has no cervical adenopathy.          Assessment & Plan:

## 2016-09-30 NOTE — Assessment & Plan Note (Signed)
Seems like mild form of the norovirus around No dehydration Just washed out now Can probably go back to work 2/7 Discussed supportive care

## 2016-10-29 ENCOUNTER — Other Ambulatory Visit: Payer: Self-pay

## 2016-10-29 DIAGNOSIS — Z1231 Encounter for screening mammogram for malignant neoplasm of breast: Secondary | ICD-10-CM

## 2017-01-13 ENCOUNTER — Ambulatory Visit
Admission: RE | Admit: 2017-01-13 | Discharge: 2017-01-13 | Disposition: A | Discharge status: Home / Self Care | Payer: Managed Care, Other (non HMO) | Source: Ambulatory Visit | Attending: General Surgery | Admitting: General Surgery

## 2017-01-13 DIAGNOSIS — Z1231 Encounter for screening mammogram for malignant neoplasm of breast: Secondary | ICD-10-CM | POA: Diagnosis present

## 2017-01-23 ENCOUNTER — Other Ambulatory Visit: Payer: Self-pay | Admitting: Internal Medicine

## 2017-01-23 ENCOUNTER — Encounter: Payer: Self-pay | Admitting: General Surgery

## 2017-01-23 ENCOUNTER — Ambulatory Visit (INDEPENDENT_AMBULATORY_CARE_PROVIDER_SITE_OTHER): Payer: Managed Care, Other (non HMO) | Admitting: General Surgery

## 2017-01-23 VITALS — BP 120/90 | HR 74 | Resp 14 | Ht 64.5 in | Wt 169.0 lb

## 2017-01-23 DIAGNOSIS — Z803 Family history of malignant neoplasm of breast: Secondary | ICD-10-CM

## 2017-01-23 DIAGNOSIS — N6011 Diffuse cystic mastopathy of right breast: Secondary | ICD-10-CM

## 2017-01-23 DIAGNOSIS — N6019 Diffuse cystic mastopathy of unspecified breast: Secondary | ICD-10-CM

## 2017-01-23 NOTE — Progress Notes (Signed)
Patient ID: [name], female   DOB: 09-25-1970, 46 y.o.   MRN: [id number]  Chief Complaint  Patient presents with  . Follow-up    mammogram    HPI [name] is a 46 y.o. female.  who presents for a breast evaluation. The most recent mammogram was done on 01-13-17.  Patient does perform regular self breast checks and gets regular mammograms done.  No new breast issues.   HPI  Past Medical History:  Diagnosis Date  . Allergy   . Breast fibroadenoma   . Diverticulitis   . GERD (gastroesophageal reflux disease)   . Hypertension   . Migraines   . Migraines   . Premenstrual dysphoric disorder     Past Surgical History:  Procedure Laterality Date  . BACK SURGERY  3/07   c6-c7 fused   . BREAST BIOPSY Left 2013   negative with clip  . BREAST EXCISIONAL BIOPSY Bilateral yrs ago   benign  . CERVICAL FUSION     C6-7  . DILATION AND CURETTAGE OF UTERUS  10/20/14  . TONSILLECTOMY    . Uterine ablation  5/16   Dr De Burrs  . wisdom teeth removal   1996    Family History  Problem Relation Age of Onset  . Coronary artery disease Father   . Hypertension Father   . Colon cancer Maternal Uncle   . Colon cancer Maternal Grandmother   . Coronary artery disease Paternal Grandmother   . Colon cancer Paternal Grandfather   . Breast cancer Sister   . Colon polyps Mother     Social History Social History  Substance Use Topics  . Smoking status: Never Smoker  . Smokeless tobacco: Never Used  . Alcohol use No    Allergies  Allergen Reactions  . Codeine Sulfate     REACTION: Nausea    Current Outpatient Prescriptions  Medication Sig Dispense Refill  . butalbital-acetaminophen-caffeine (FIORICET, ESGIC) 50-325-40 MG tablet TAKE ONE TABLET BY MOUTH EVERY 4 HOURS AS NEEDED FOR HEADACHE 30 tablet 0  . cetirizine (ZYRTEC) 10 MG tablet Take 10 mg by mouth daily as needed.      Marland Kitchen losartan-hydrochlorothiazide (HYZAAR) 100-25 MG tablet TAKE ONE (1) TABLET BY MOUTH EVERY  DAY 90 tablet 1  . Multiple Vitamin (MULTIVITAMIN) capsule Take 1 capsule by mouth daily.      Marland Kitchen omeprazole (PRILOSEC) 20 MG capsule TAKE ONE (1) CAPSULE BY MOUTH 2 TIMES DAILY BEFORE MEALS. 60 capsule 11  . triamcinolone cream (KENALOG) 0.1 % Apply 1 application topically 2 (two) times daily as needed. 45 g 1  . sertraline (ZOLOFT) 50 MG tablet TAKE ONE (1) TABLET BY MOUTH EVERY DAY 90 tablet 0   No current facility-administered medications for this visit.     Review of Systems Review of Systems  Constitutional: Negative.   Respiratory: Negative.   Cardiovascular: Negative.     Blood pressure 120/90, pulse 74, resp. rate 14, height 5' 4.5" (1.638 m), weight 169 lb (76.7 kg), SpO2 97 %.  Physical Exam Physical Exam  Constitutional: She is oriented to person, place, and time. She appears well-developed and well-nourished.  HENT:  Mouth/Throat: Oropharynx is clear and moist.  Eyes: Conjunctivae are normal. No scleral icterus.  Neck: Neck supple.  Cardiovascular: Normal rate, regular rhythm and normal heart sounds.   Pulmonary/Chest: Effort normal and breath sounds normal. Right breast exhibits no inverted nipple, no mass, no nipple discharge, no skin change and no tenderness. Left breast exhibits no inverted  nipple, no mass, no nipple discharge, no skin change and no tenderness.  Left breast > right breast mild thickness inferior fold, unchanged from before. No defined mass. She does wear a under wire bra.   Abdominal: Soft. There is no tenderness.  Lymphadenopathy:    She has no cervical adenopathy.    She has no axillary adenopathy.  Neurological: She is alert and oriented to person, place, and time.  Skin: Skin is warm and dry.  Psychiatric: Her behavior is normal.    Data Reviewed  Mammogram reviewed and stable.  Assessment    Stable physical exam. FCD. FH of breast cancer Pt feels her mother was genetically tested, negative.     Plan    She has seen Dr Vira Agar and  plans to schedule a colonoscopy too.  Patient will be asked to return to the office in one year with a bilateral screening mammogram with her PCP Dr Silvio Pate.  The patient is aware to call back for any questions or new concerns.      HPI, Physical Exam, Assessment and Plan have been scribed under the direction and in the presence of Mckinley Jewel, MD  [name], RN  I have completed the exam and reviewed the above documentation for accuracy and completeness.  I agree with the above.  Haematologist has been used and any errors in dictation or transcription are unintentional.  Delara Shepheard G. Jamal Collin, M.D., F.A.C.S.   Junie Panning G 01/27/2017, 8:14 AM

## 2017-01-23 NOTE — Patient Instructions (Addendum)
The patient is aware to call back for any questions or new concerns. Patient will be asked to return to the office in one year with a bilateral screening mammogram with her PCP Dr Silvio Pate.

## 2017-02-04 ENCOUNTER — Encounter: Payer: Self-pay | Admitting: Internal Medicine

## 2017-02-04 ENCOUNTER — Ambulatory Visit (INDEPENDENT_AMBULATORY_CARE_PROVIDER_SITE_OTHER): Payer: Managed Care, Other (non HMO) | Admitting: Internal Medicine

## 2017-02-04 VITALS — BP 136/88 | HR 65 | Temp 98.3°F | Ht 64.5 in | Wt 167.0 lb

## 2017-02-04 DIAGNOSIS — M545 Low back pain: Secondary | ICD-10-CM | POA: Diagnosis not present

## 2017-02-04 DIAGNOSIS — F39 Unspecified mood [affective] disorder: Secondary | ICD-10-CM | POA: Diagnosis not present

## 2017-02-04 DIAGNOSIS — Z0000 Encounter for general adult medical examination without abnormal findings: Secondary | ICD-10-CM

## 2017-02-04 DIAGNOSIS — G8929 Other chronic pain: Secondary | ICD-10-CM | POA: Diagnosis not present

## 2017-02-04 DIAGNOSIS — I10 Essential (primary) hypertension: Secondary | ICD-10-CM

## 2017-02-04 DIAGNOSIS — R1314 Dysphagia, pharyngoesophageal phase: Secondary | ICD-10-CM

## 2017-02-04 DIAGNOSIS — Z0001 Encounter for general adult medical examination with abnormal findings: Secondary | ICD-10-CM

## 2017-02-04 LAB — CBC WITH DIFFERENTIAL/PLATELET
BASOS PCT: 0.6 % (ref 0.0–3.0)
Basophils Absolute: 0 10*3/uL (ref 0.0–0.1)
EOS PCT: 1 % (ref 0.0–5.0)
Eosinophils Absolute: 0.1 10*3/uL (ref 0.0–0.7)
HCT: 39.8 % (ref 36.0–46.0)
HEMOGLOBIN: 13.5 g/dL (ref 12.0–15.0)
LYMPHS ABS: 1.8 10*3/uL (ref 0.7–4.0)
Lymphocytes Relative: 31.3 % (ref 12.0–46.0)
MCHC: 33.9 g/dL (ref 30.0–36.0)
MCV: 83.6 fl (ref 78.0–100.0)
Monocytes Absolute: 0.4 10*3/uL (ref 0.1–1.0)
Monocytes Relative: 7.5 % (ref 3.0–12.0)
Neutro Abs: 3.4 10*3/uL (ref 1.4–7.7)
Neutrophils Relative %: 59.6 % (ref 43.0–77.0)
Platelets: 302 10*3/uL (ref 150.0–400.0)
RBC: 4.77 Mil/uL (ref 3.87–5.11)
RDW: 13.8 % (ref 11.5–15.5)
WBC: 5.7 10*3/uL (ref 4.0–10.5)

## 2017-02-04 LAB — COMPREHENSIVE METABOLIC PANEL
ALK PHOS: 90 U/L (ref 39–117)
ALT: 22 U/L (ref 0–35)
AST: 18 U/L (ref 0–37)
Albumin: 4.1 g/dL (ref 3.5–5.2)
BUN: 15 mg/dL (ref 6–23)
CHLORIDE: 103 meq/L (ref 96–112)
CO2: 32 mEq/L (ref 19–32)
Calcium: 9.2 mg/dL (ref 8.4–10.5)
Creatinine, Ser: 0.76 mg/dL (ref 0.40–1.20)
GFR: 86.39 mL/min (ref >60.00)
GLUCOSE: 89 mg/dL (ref 70–99)
POTASSIUM: 3.4 meq/L — AB (ref 3.5–5.1)
Sodium: 139 mEq/L (ref 135–145)
Total Bilirubin: 0.5 mg/dL (ref 0.2–1.2)
Total Protein: 7.2 g/dL (ref 6.0–8.3)

## 2017-02-04 LAB — LIPID PANEL
CHOL/HDL RATIO: 4
Cholesterol: 197 mg/dL (ref 0–200)
HDL: 48.7 mg/dL (ref >39.00)
LDL Cholesterol: 124 mg/dL — ABNORMAL HIGH (ref 0–99)
NONHDL: 147.98
Triglycerides: 119 mg/dL (ref 0.0–149.0)
VLDL: 23.8 mg/dL (ref 0.0–40.0)

## 2017-02-04 LAB — T4, FREE: Free T4: 0.7 ng/dL (ref 0.60–1.60)

## 2017-02-04 NOTE — Assessment & Plan Note (Signed)
Ongoing symptoms Consider physiatry Discussed trying inversion table

## 2017-02-04 NOTE — Assessment & Plan Note (Signed)
Healthy Discussed fitness Pap due 2022 Gets yearly mammograms Colon cancer screening at 62

## 2017-02-04 NOTE — Assessment & Plan Note (Signed)
BP Readings from Last 3 Encounters:  02/04/17 136/88  01/23/17 120/90  09/30/16 122/88   Good control No changes needed

## 2017-02-04 NOTE — Assessment & Plan Note (Signed)
Mildly persistent but mostly controlled with PPI Holding off on EGD

## 2017-02-04 NOTE — Assessment & Plan Note (Signed)
Does well with low dose sertraline Didn't do well when off it Reconsider after menopause??

## 2017-02-04 NOTE — Progress Notes (Signed)
Subjective:    Patient ID: [name], female    DOB: 07/01/1971, 46 y.o.   MRN: [id number]  HPI Here for physical  Having ongoing back problems Using aleve or ibuprofen--- MRI over 10 years (just bulging disc then) Radiates out and into hips--not much into legs No leg weakness Still works out regularly Awakens her at times Sees chiropractor--this does help  Discussed considering inversion table  Having trouble sleeping lately Awakening frequently  Having a lot of stress--- husband with CABG recently Discussed melatonin  Current Outpatient Prescriptions on File Prior to Visit  Medication Sig Dispense Refill  . butalbital-acetaminophen-caffeine (FIORICET, ESGIC) 50-325-40 MG tablet TAKE ONE TABLET BY MOUTH EVERY 4 HOURS AS NEEDED FOR HEADACHE 30 tablet 0  . cetirizine (ZYRTEC) 10 MG tablet Take 10 mg by mouth daily as needed.      Marland Kitchen losartan-hydrochlorothiazide (HYZAAR) 100-25 MG tablet TAKE ONE (1) TABLET BY MOUTH EVERY DAY 90 tablet 1  . Multiple Vitamin (MULTIVITAMIN) capsule Take 1 capsule by mouth daily.      Marland Kitchen omeprazole (PRILOSEC) 20 MG capsule TAKE ONE (1) CAPSULE BY MOUTH 2 TIMES DAILY BEFORE MEALS. 60 capsule 11  . sertraline (ZOLOFT) 50 MG tablet TAKE ONE (1) TABLET BY MOUTH EVERY DAY 90 tablet 0  . triamcinolone cream (KENALOG) 0.1 % Apply 1 application topically 2 (two) times daily as needed. 45 g 1   No current facility-administered medications on file prior to visit.     Allergies  Allergen Reactions  . Codeine Sulfate     REACTION: Nausea    Past Medical History:  Diagnosis Date  . Allergy   . Breast fibroadenoma   . Diverticulitis   . GERD (gastroesophageal reflux disease)   . Hypertension   . Migraines   . Migraines   . Premenstrual dysphoric disorder     Past Surgical History:  Procedure Laterality Date  . BACK SURGERY  3/07   c6-c7 fused   . BREAST BIOPSY Left 2013   negative with clip  . BREAST EXCISIONAL BIOPSY Bilateral yrs ago   benign  . CERVICAL FUSION     C6-7  . DILATION AND CURETTAGE OF UTERUS  10/20/14  . TONSILLECTOMY    . Uterine ablation  5/16   Dr De Burrs  . wisdom teeth removal   1996    Family History  Problem Relation Age of Onset  . Coronary artery disease Father   . Hypertension Father   . Colon cancer Maternal Uncle   . Colon cancer Maternal Grandmother   . Coronary artery disease Paternal Grandmother   . Colon cancer Paternal Grandfather   . Breast cancer Sister   . Colon polyps Mother     Social History   Social History  . Marital status: Married    Spouse name: N/A  . Number of children: 2  . Years of education: N/A   Occupational History  .  Hospice nurse   .      Social History Main Topics  . Smoking status: Never Smoker  . Smokeless tobacco: Never Used  . Alcohol use No  . Drug use: No  . Sexual activity: Not on file   Other Topics Concern  . Not on file   Social History Narrative   Divorced 2012--- remarried 2015   Review of Systems  Constitutional: Negative for fatigue and unexpected weight change.       Wears seat belt  HENT: Negative for dental problem, hearing loss and tinnitus.  Keeps up with dentist  Eyes: Negative for visual disturbance.       No diplopia or unilateral vision loss  Respiratory: Negative for cough, chest tightness and shortness of breath.   Cardiovascular: Negative for chest pain and leg swelling.       Rare palpitations --mostly in bed  Gastrointestinal: Negative for abdominal pain, blood in stool, constipation and nausea.       Heartburn controlled with PPI Stable occasional dysphagia  Endocrine: Negative for polydipsia and polyuria.  Genitourinary: Positive for dysuria. Negative for dyspareunia and hematuria.       No periods--only occasional spotting  Musculoskeletal: Positive for back pain. Negative for arthralgias and joint swelling.  Skin: Negative for rash.       Keeps up with derm--yearly check this week    Allergic/Immunologic: Positive for environmental allergies. Negative for immunocompromised state.       Satisfied with Rx  Neurological: Negative for dizziness, syncope and light-headedness.       Hands go numb at times---driving/computer. using braces at night Headaches are better  Hematological: Negative for adenopathy. Does not bruise/bleed easily.  Psychiatric/Behavioral: Positive for sleep disturbance. Negative for dysphoric mood. The patient is not nervous/anxious.        Objective:   Physical Exam  Constitutional: She is oriented to person, place, and time. She appears well-developed and well-nourished. No distress.  HENT:  Head: Normocephalic and atraumatic.  Right Ear: External ear normal.  Left Ear: External ear normal.  Mouth/Throat: Oropharynx is clear and moist. No oropharyngeal exudate.  Eyes: Conjunctivae are normal. Pupils are equal, round, and reactive to light.  Neck: Normal range of motion. Neck supple. No thyromegaly present.  Cardiovascular: Normal rate, regular rhythm, normal heart sounds and intact distal pulses.  Exam reveals no gallop.   No murmur heard. Pulmonary/Chest: Effort normal and breath sounds normal. No respiratory distress. She has no wheezes. She has no rales.  Abdominal: Soft. There is no tenderness.  Musculoskeletal: She exhibits no edema or tenderness.  Lymphadenopathy:    She has no cervical adenopathy.  Neurological: She is alert and oriented to person, place, and time.  Skin: No rash noted. No erythema.  Psychiatric: She has a normal mood and affect. Her behavior is normal.          Assessment & Plan:

## 2017-02-04 NOTE — Patient Instructions (Signed)
Please try an inversion table. If you want to see a physiatrist, let me know (Dr Sharlet Salina at [facility]).

## 2017-03-23 ENCOUNTER — Other Ambulatory Visit: Payer: Self-pay | Admitting: Internal Medicine

## 2017-04-09 ENCOUNTER — Other Ambulatory Visit: Payer: Self-pay | Admitting: Internal Medicine

## 2017-04-23 ENCOUNTER — Other Ambulatory Visit: Payer: Self-pay | Admitting: Internal Medicine

## 2017-08-05 ENCOUNTER — Other Ambulatory Visit: Payer: Self-pay | Admitting: Internal Medicine

## 2017-08-06 NOTE — Telephone Encounter (Signed)
Approved: 30 x 0 

## 2017-08-06 NOTE — Telephone Encounter (Signed)
Left refill on voice mail at pharmacy  

## 2017-08-06 NOTE — Telephone Encounter (Signed)
Last filled 08-22-16  #30 Last OV 02-04-17 Next OV 02-10-18

## 2017-10-06 ENCOUNTER — Telehealth: Payer: Self-pay | Admitting: Internal Medicine

## 2017-10-06 ENCOUNTER — Other Ambulatory Visit: Payer: Self-pay | Admitting: *Deleted

## 2017-10-06 MED ORDER — LOSARTAN POTASSIUM-HCTZ 100-25 MG PO TABS: ORAL_TABLET | ORAL | 1 refills | Status: DC

## 2017-10-06 NOTE — Telephone Encounter (Signed)
Copied from [place]. Topic: Quick Communication - Rx Refill/Question >> Oct 06, 2017 10:51 AM Lolita Rieger, RMA wrote: Medication: losartan hctz 100-25 mg   Has the patient contacted their pharmacy?yes   (Agent: If no, request that the patient contact the pharmacy for the refill.)   Preferred Pharmacy (with phone number or street name): [facility]   Agent: Please be advised that RX refills may take up to 3 business days. We ask that you follow-up with your pharmacy.

## 2017-10-06 NOTE — Telephone Encounter (Signed)
Rx sent to new pharmacy- refills through 02/10/18

## 2017-12-02 ENCOUNTER — Other Ambulatory Visit: Payer: Self-pay | Admitting: Internal Medicine

## 2017-12-02 DIAGNOSIS — Z1231 Encounter for screening mammogram for malignant neoplasm of breast: Secondary | ICD-10-CM

## 2018-01-14 ENCOUNTER — Ambulatory Visit
Admission: RE | Admit: 2018-01-14 | Discharge: 2018-01-14 | Disposition: A | Discharge status: Home / Self Care | Payer: Managed Care, Other (non HMO) | Source: Ambulatory Visit | Attending: Internal Medicine | Admitting: Internal Medicine

## 2018-01-14 DIAGNOSIS — Z1231 Encounter for screening mammogram for malignant neoplasm of breast: Secondary | ICD-10-CM | POA: Diagnosis present

## 2018-01-15 ENCOUNTER — Other Ambulatory Visit: Payer: Self-pay | Admitting: Internal Medicine

## 2018-01-15 DIAGNOSIS — N6310 Unspecified lump in the right breast, unspecified quadrant: Secondary | ICD-10-CM

## 2018-01-15 DIAGNOSIS — R928 Other abnormal and inconclusive findings on diagnostic imaging of breast: Secondary | ICD-10-CM

## 2018-01-22 ENCOUNTER — Ambulatory Visit
Admission: RE | Admit: 2018-01-22 | Discharge: 2018-01-22 | Disposition: A | Discharge status: Home / Self Care | Payer: Managed Care, Other (non HMO) | Source: Ambulatory Visit | Attending: Internal Medicine | Admitting: Internal Medicine

## 2018-01-22 DIAGNOSIS — R928 Other abnormal and inconclusive findings on diagnostic imaging of breast: Secondary | ICD-10-CM | POA: Diagnosis not present

## 2018-01-22 DIAGNOSIS — N6310 Unspecified lump in the right breast, unspecified quadrant: Secondary | ICD-10-CM

## 2018-02-10 ENCOUNTER — Ambulatory Visit (INDEPENDENT_AMBULATORY_CARE_PROVIDER_SITE_OTHER): Payer: Managed Care, Other (non HMO) | Admitting: Internal Medicine

## 2018-02-10 ENCOUNTER — Encounter: Payer: Self-pay | Admitting: Internal Medicine

## 2018-02-10 VITALS — BP 120/84 | HR 78 | Temp 98.6°F | Ht 64.5 in | Wt 168.0 lb

## 2018-02-10 DIAGNOSIS — I10 Essential (primary) hypertension: Secondary | ICD-10-CM | POA: Diagnosis not present

## 2018-02-10 DIAGNOSIS — R194 Change in bowel habit: Secondary | ICD-10-CM | POA: Insufficient documentation

## 2018-02-10 DIAGNOSIS — Z0000 Encounter for general adult medical examination without abnormal findings: Secondary | ICD-10-CM | POA: Diagnosis not present

## 2018-02-10 DIAGNOSIS — F39 Unspecified mood [affective] disorder: Secondary | ICD-10-CM | POA: Diagnosis not present

## 2018-02-10 DIAGNOSIS — K2100 Gastro-esophageal reflux disease with esophagitis, without bleeding: Secondary | ICD-10-CM

## 2018-02-10 LAB — COMPREHENSIVE METABOLIC PANEL
ALK PHOS: 91 U/L (ref 39–117)
ALT: 24 U/L (ref 0–35)
AST: 16 U/L (ref 0–37)
Albumin: 4.2 g/dL (ref 3.5–5.2)
BUN: 16 mg/dL (ref 6–23)
CHLORIDE: 99 meq/L (ref 96–112)
CO2: 32 meq/L (ref 19–32)
Calcium: 9.2 mg/dL (ref 8.4–10.5)
Creatinine, Ser: 0.77 mg/dL (ref 0.40–1.20)
GFR: 84.74 mL/min (ref >60.00)
GLUCOSE: 97 mg/dL (ref 70–99)
POTASSIUM: 3.1 meq/L — AB (ref 3.5–5.1)
SODIUM: 140 meq/L (ref 135–145)
Total Bilirubin: 0.4 mg/dL (ref 0.2–1.2)
Total Protein: 7.3 g/dL (ref 6.0–8.3)

## 2018-02-10 LAB — CBC
HEMATOCRIT: 39.8 % (ref 36.0–46.0)
HEMOGLOBIN: 14.1 g/dL (ref 12.0–15.0)
MCHC: 35.4 g/dL (ref 30.0–36.0)
MCV: 82.6 fl (ref 78.0–100.0)
Platelets: 340 10*3/uL (ref 150.0–400.0)
RBC: 4.82 Mil/uL (ref 3.87–5.11)
RDW: 13.4 % (ref 11.5–15.5)
WBC: 8.9 10*3/uL (ref 4.0–10.5)

## 2018-02-10 NOTE — Assessment & Plan Note (Signed)
Probably benign Will check FIT

## 2018-02-10 NOTE — Assessment & Plan Note (Signed)
BP Readings from Last 3 Encounters:  02/10/18 120/84  02/04/17 136/88  01/23/17 120/90   Good control Due for labs

## 2018-02-10 NOTE — Assessment & Plan Note (Signed)
Fine on the low dose sertraline--will continue

## 2018-02-10 NOTE — Progress Notes (Signed)
Subjective:    Patient ID: [name], female    DOB: 1970/10/18, 47 y.o.   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 butalbital-acetaminophen-caffeine (FIORICET, ESGIC) 50-325-40 MG tablet TAKE ONE TABLET BY MOUTH EVERY 4 HOURS AS NEEDED FOR HEADACHE 30 tablet 0  . cetirizine (ZYRTEC) 10 MG tablet Take 10 mg by mouth daily as needed.      Marland Kitchen losartan-hydrochlorothiazide (HYZAAR) 100-25 MG tablet TAKE ONE (1) TABLET BY MOUTH EVERY DAY 90 tablet 1  . Multiple Vitamin (MULTIVITAMIN) capsule Take 1 capsule by mouth daily.      Marland Kitchen omeprazole (PRILOSEC) 20 MG capsule TAKE ONE (1) CAPSULE BY MOUTH 2 TIMES DAILY BEFORE A MEAL 60 capsule 11  . sertraline (ZOLOFT) 50 MG tablet TAKE ONE (1) TABLET BY MOUTH EVERY DAY 90 tablet 3  . triamcinolone cream (KENALOG) 0.1 % Apply 1 application topically 2 (two) times daily as needed. 45 g 1   No current facility-administered medications on file prior to visit.     Allergies  Allergen Reactions  . Codeine Sulfate     REACTION: Nausea    Past Medical History:  Diagnosis Date  . Allergy   . Breast fibroadenoma   . Diverticulitis   . GERD (gastroesophageal reflux disease)   . Hypertension   . Migraines   . Migraines   . Premenstrual dysphoric disorder     Past Surgical History:  Procedure Laterality Date  . BACK SURGERY  3/07   c6-c7 fused   . BREAST BIOPSY Left 2013   negative with clip  . BREAST EXCISIONAL BIOPSY Bilateral yrs ago   benign  . CERVICAL FUSION     C6-7  . DILATION AND CURETTAGE OF UTERUS  10/20/14  . TONSILLECTOMY    . Uterine ablation  5/16   Dr De Burrs  . wisdom teeth removal   1996    Family History  Problem Relation Age of Onset  . Coronary artery disease Father   . Hypertension Father   . Colon cancer Maternal Uncle   . Colon cancer Maternal Grandmother   . Coronary artery disease Paternal Grandmother   . Colon cancer Paternal Grandfather   . Breast cancer Sister   . Colon polyps Mother     Social History   Socioeconomic History  . Marital status: Married    Spouse name: Not on file  . Number of children: 2  . Years of education: Not on file  . Highest education level: Not on file  Occupational History  . Occupation:  Merchandiser, retail  . Occupation:    Social Needs  . Financial resource strain: Not on file  . Food insecurity:    Worry: Not on file    Inability: Not on file  . Transportation needs:    Medical: Not on file    Non-medical: Not on file  Tobacco Use  . Smoking status: Never Smoker  .  Smokeless tobacco: Never Used  Substance and Sexual Activity  . Alcohol use: No    Alcohol/week: 0.0 oz  . Drug use: No  . Sexual activity: Not on file  Lifestyle  . Physical activity:    Days per week: Not on file    Minutes per session: Not on file  . Stress: Not on file  Relationships  . Social connections:    Talks on phone: Not on file    Gets together: Not on file    Attends religious service: Not on file    Active member of club or organization: Not on file    Attends meetings of clubs or organizations: Not on file    Relationship status: Not on file  . Intimate partner violence:    Fear of current or ex partner: Not on file    Emotionally abused: Not on file    Physically abused: Not on file    Forced sexual activity: Not on file  Other Topics Concern  . Not on file  Social History  Narrative   Divorced 2012--- remarried 2015   Review of Systems  Constitutional: Negative for fatigue and unexpected weight change.       Continues to exercise Wears seat belt  HENT: Negative for dental problem, hearing loss, tinnitus and trouble swallowing.        Keeps up with dentist  Eyes: Negative for visual disturbance.       No diplopia or unilateral vision loss  Respiratory: Negative for cough, chest tightness and shortness of breath.   Cardiovascular: Negative for chest pain and leg swelling.       Occ flutter---no racing  Gastrointestinal: Negative for blood in stool.       No heartburn on the PPI   Genitourinary: Negative for difficulty urinating, dyspareunia, dysuria and hematuria.       Regular periods---just spotting  Musculoskeletal: Positive for back pain. Negative for arthralgias and joint swelling.       Seeing chiropractor monthly  Skin:       2 biopsies at Dr Kellie Moor last week--awaiting pathology Non specific rashes--?allergic  Allergic/Immunologic: Negative for immunocompromised state.       Okay on the OTC med  Neurological: Negative for dizziness, syncope and light-headedness.       Headaches slightly worse---fioricet helps (1-2 per month)  Hematological: Negative for adenopathy. Does not bruise/bleed easily.  Psychiatric/Behavioral: Negative for dysphoric mood and sleep disturbance. The patient is not nervous/anxious.        Mood level with the sertraline       Objective:   Physical Exam  Constitutional: She is oriented to person, place, and time. She appears well-developed. No distress.  HENT:  Head: Normocephalic and atraumatic.  Right Ear: External ear normal.  Left Ear: External ear normal.  Mouth/Throat: Oropharynx is clear and moist. No oropharyngeal exudate.  Eyes: Pupils are equal, round, and reactive to light. Conjunctivae are normal.  Neck: No thyromegaly present.  Cardiovascular: Normal rate, regular rhythm, normal heart sounds and  intact distal pulses. Exam reveals no gallop.  No murmur heard. Respiratory: Effort normal and breath sounds normal. No respiratory distress. She has no wheezes. She has no rales.  GI: Soft. There is no tenderness.  Musculoskeletal: She exhibits no edema or tenderness.  Lymphadenopathy:    She has no cervical adenopathy.  Neurological: She is alert and oriented to person, place, and time.  Skin: No rash noted. No erythema.  Psychiatric: She has a normal mood  and affect.           Assessment & Plan:

## 2018-02-10 NOTE — Assessment & Plan Note (Signed)
Quiet on PPI 

## 2018-02-10 NOTE — Assessment & Plan Note (Signed)
Healthy Exercises regularly Generally doesn't take flu vaccine--recommended Pap due 2022 Going back for 6 month breast ultrasound

## 2018-02-11 ENCOUNTER — Other Ambulatory Visit: Payer: Self-pay | Admitting: Internal Medicine

## 2018-02-11 ENCOUNTER — Other Ambulatory Visit: Payer: Self-pay

## 2018-02-11 DIAGNOSIS — E876 Hypokalemia: Secondary | ICD-10-CM

## 2018-02-11 MED ORDER — POTASSIUM CHLORIDE CRYS ER 20 MEQ PO TBCR: 20.0000 meq | EXTENDED_RELEASE_TABLET | Freq: Every day | ORAL | 11 refills | Status: DC

## 2018-02-11 NOTE — Telephone Encounter (Signed)
Potassium RX sent to the pharmacy. 

## 2018-02-12 ENCOUNTER — Encounter: Payer: Self-pay | Admitting: Internal Medicine

## 2018-02-12 MED ORDER — MEBENDAZOLE 100 MG PO CHEW: 100.0000 mg | CHEWABLE_TABLET | Freq: Once | ORAL | 0 refills | Status: AC

## 2018-02-16 ENCOUNTER — Other Ambulatory Visit (INDEPENDENT_AMBULATORY_CARE_PROVIDER_SITE_OTHER): Payer: Managed Care, Other (non HMO)

## 2018-02-16 DIAGNOSIS — R194 Change in bowel habit: Secondary | ICD-10-CM | POA: Diagnosis not present

## 2018-02-17 LAB — FECAL OCCULT BLOOD, IMMUNOCHEMICAL: FECAL OCCULT BLD: NEGATIVE

## 2018-02-27 ENCOUNTER — Other Ambulatory Visit (INDEPENDENT_AMBULATORY_CARE_PROVIDER_SITE_OTHER): Payer: Managed Care, Other (non HMO)

## 2018-02-27 DIAGNOSIS — E876 Hypokalemia: Secondary | ICD-10-CM

## 2018-02-27 LAB — POTASSIUM: POTASSIUM: 3.8 meq/L (ref 3.5–5.1)

## 2018-03-25 ENCOUNTER — Other Ambulatory Visit: Payer: Self-pay | Admitting: Internal Medicine

## 2018-04-06 ENCOUNTER — Other Ambulatory Visit: Payer: Self-pay | Admitting: Internal Medicine

## 2018-06-22 DIAGNOSIS — N6310 Unspecified lump in the right breast, unspecified quadrant: Secondary | ICD-10-CM

## 2018-07-27 ENCOUNTER — Ambulatory Visit: Payer: Managed Care, Other (non HMO) | Admitting: Internal Medicine

## 2018-07-27 ENCOUNTER — Other Ambulatory Visit: Payer: Managed Care, Other (non HMO)

## 2018-07-27 ENCOUNTER — Encounter: Payer: Self-pay | Admitting: Internal Medicine

## 2018-07-27 VITALS — BP 120/88 | HR 88 | Temp 98.3°F | Resp 18 | Ht 64.5 in | Wt 169.0 lb

## 2018-07-27 DIAGNOSIS — J209 Acute bronchitis, unspecified: Secondary | ICD-10-CM

## 2018-07-27 MED ORDER — BENZONATATE 200 MG PO CAPS: 200.0000 mg | ORAL_CAPSULE | Freq: Three times a day (TID) | ORAL | 0 refills | Status: DC | PRN

## 2018-07-27 NOTE — Progress Notes (Signed)
Subjective:    Patient ID: [name], female    DOB: 1971-07-09, 47 y.o.   MRN: [id number]  HPI Here due to cough  6 days of cough--"I'm worn out" Thick yellow mucus No fever, chills Some sweats Feels lethargic SOB with any activity Did work some 3 days ago---off the weekend  Using mucinex in day--- nyquil at night Sleeping okay No sore throat Some headache from cough Chest and back hurt from cough No ear pain No sinus congestion  Current Outpatient Medications on File Prior to Visit  Medication Sig Dispense Refill  . butalbital-acetaminophen-caffeine (FIORICET, ESGIC) 50-325-40 MG tablet TAKE ONE TABLET BY MOUTH EVERY 4 HOURS AS NEEDED FOR HEADACHE 30 tablet 0  . cetirizine (ZYRTEC) 10 MG tablet Take 10 mg by mouth daily as needed.      Marland Kitchen losartan-hydrochlorothiazide (HYZAAR) 100-25 MG tablet TAKE 1 TABLET DAILY 90 tablet 3  . Multiple Vitamin (MULTIVITAMIN) capsule Take 1 capsule by mouth daily.      Marland Kitchen omeprazole (PRILOSEC) 20 MG capsule TAKE 1 CAPSULE BY MOUTH TWICE DAILY AS DIRECTED BEFORE A MEAL 60 capsule 11  . potassium chloride SA (K-DUR,KLOR-CON) 20 MEQ tablet Take 1 tablet (20 mEq total) by mouth daily. 30 tablet 11  . sertraline (ZOLOFT) 50 MG tablet TAKE 1 TABLET DAILY 90 tablet 3  . triamcinolone cream (KENALOG) 0.1 % Apply 1 application topically 2 (two) times daily as needed. 45 g 1   No current facility-administered medications on file prior to visit.     Allergies  Allergen Reactions  . Codeine Sulfate     REACTION: Nausea    Past Medical History:  Diagnosis Date  . Allergy   . Breast fibroadenoma   . Diverticulitis   . GERD (gastroesophageal reflux disease)   . Hypertension   . Migraines   . Migraines   . Premenstrual dysphoric disorder     Past Surgical History:  Procedure Laterality Date  . BACK SURGERY  3/07   c6-c7 fused   . BREAST BIOPSY Left 2013   negative with clip  . BREAST EXCISIONAL BIOPSY Bilateral yrs ago   benign  .  CERVICAL FUSION     C6-7  . DILATION AND CURETTAGE OF UTERUS  10/20/14  . TONSILLECTOMY    . Uterine ablation  5/16   Dr De Burrs  . wisdom teeth removal   1996    Family History  Problem Relation Age of Onset  . Coronary artery disease Father   . Hypertension Father   . Colon cancer Maternal Uncle   . Colon cancer Maternal Grandmother   . Coronary artery disease Paternal Grandmother   . Colon cancer Paternal Grandfather   . Breast cancer Sister   . Colon polyps Mother     Social History   Socioeconomic History  . Marital status: Married    Spouse name: Not on file  . Number of children: 2  . Years of education: Not on file  . Highest education level: Not on file  Occupational History  . Occupation:  Merchandiser, retail  . Occupation:    Social Needs  . Financial resource strain: Not on file  . Food insecurity:    Worry: Not on file    Inability: Not on file  . Transportation needs:    Medical: Not on file    Non-medical: Not on file  Tobacco Use  . Smoking status: Never Smoker  . Smokeless tobacco: Never Used  Substance and Sexual Activity  .  Alcohol use: No    Alcohol/week: 0.0 standard drinks  . Drug use: No  . Sexual activity: Not on file  Lifestyle  . Physical activity:    Days per week: Not on file    Minutes per session: Not on file  . Stress: Not on file  Relationships  . Social connections:    Talks on phone: Not on file    Gets together: Not on file    Attends religious service: Not on file    Active member of club or organization: Not on file    Attends meetings of clubs or organizations: Not on file    Relationship status: Not on file  . Intimate partner violence:    Fear of current or ex partner: Not on file    Emotionally abused: Not on file    Physically abused: Not on file    Forced sexual activity: Not on file  Other Topics Concern  . Not on file  Social History Narrative   Divorced 2012--- remarried 2015   Review of  Systems Appetite is off No N/V or diarrhea    Objective:   Physical Exam  Constitutional: She appears well-developed. No distress.  HENT:  Mouth/Throat: Oropharynx is clear and moist. No oropharyngeal exudate.  No sinus tenderness TMs normal Mild nasal congestion  Neck: No thyromegaly present.  Respiratory: Effort normal and breath sounds normal. No respiratory distress. She has no wheezes. She has no rales.  Lymphadenopathy:    She has no cervical adenopathy.           Assessment & Plan:

## 2018-07-27 NOTE — Assessment & Plan Note (Signed)
Discussed usual viral etiology Discussed cough meds and analgesics If worsens later in the week, would try empiric doxy

## 2018-08-03 MED ORDER — DOXYCYCLINE HYCLATE 100 MG PO TABS: 100.0000 mg | ORAL_TABLET | Freq: Two times a day (BID) | ORAL | 0 refills | Status: DC

## 2018-08-03 MED ORDER — PREDNISONE 20 MG PO TABS: 40.0000 mg | ORAL_TABLET | Freq: Every day | ORAL | 0 refills | Status: DC

## 2018-08-04 ENCOUNTER — Other Ambulatory Visit: Payer: Self-pay | Admitting: Internal Medicine

## 2018-08-04 ENCOUNTER — Ambulatory Visit
Admission: RE | Admit: 2018-08-04 | Discharge: 2018-08-04 | Disposition: A | Discharge status: Home / Self Care | Payer: Managed Care, Other (non HMO) | Source: Ambulatory Visit | Attending: Internal Medicine | Admitting: Internal Medicine

## 2018-08-04 DIAGNOSIS — R928 Other abnormal and inconclusive findings on diagnostic imaging of breast: Secondary | ICD-10-CM

## 2018-08-04 DIAGNOSIS — N6310 Unspecified lump in the right breast, unspecified quadrant: Secondary | ICD-10-CM | POA: Diagnosis present

## 2018-08-06 ENCOUNTER — Ambulatory Visit: Payer: Managed Care, Other (non HMO) | Admitting: General Surgery

## 2018-08-06 ENCOUNTER — Encounter: Payer: Self-pay | Admitting: General Surgery

## 2018-08-06 ENCOUNTER — Ambulatory Visit (INDEPENDENT_AMBULATORY_CARE_PROVIDER_SITE_OTHER): Payer: Managed Care, Other (non HMO)

## 2018-08-06 ENCOUNTER — Other Ambulatory Visit: Payer: Self-pay

## 2018-08-06 VITALS — BP 158/92 | HR 87 | Temp 99.0°F | Resp 18 | Wt 172.0 lb

## 2018-08-06 DIAGNOSIS — N6311 Unspecified lump in the right breast, upper outer quadrant: Secondary | ICD-10-CM

## 2018-08-06 NOTE — Progress Notes (Signed)
**Note Tracy-Identified via Obfuscation** Patient ID: [name], female   DOB: [birth year], 47 y.o.   MRN: [id number]  Chief Complaint  Patient presents with  . Breast Problem    HPI [name] is a 47 y.o. female.  who presents for a breast evaluation referred by Tracy [name], former patient of Tracy [name]. The most recent right ultrasound was done on 08-04-18. Her original mammogram was in May and they have been watching this spot which has gotten larger. Patient does perform regular self breast checks and gets regular mammograms done.  She can't feel the area of concern.  She is being treated for bronchitis, started prednisone and antibiotics Tuesday.  HPI  Past Medical History:  Diagnosis Date  . Allergy   . Breast fibroadenoma   . Diverticulitis   . GERD (gastroesophageal reflux disease)   . Hypertension   . Migraines   . Migraines   . Premenstrual dysphoric disorder     Past Surgical History:  Procedure Laterality Date  . BACK SURGERY  3/07   c6-c7 fused   . BREAST BIOPSY Left 2013   negative with clip  . BREAST EXCISIONAL BIOPSY Bilateral yrs ago   benign  . CERVICAL FUSION     C6-7  . DILATION AND CURETTAGE OF UTERUS  10/20/14  . TONSILLECTOMY    . Uterine ablation  5/16   [name]  . wisdom teeth removal   1996    Family History  Problem Relation Age of Onset  . Coronary artery disease Father   . Hypertension Father   . Colon cancer Maternal Uncle   . Colon cancer Maternal Grandmother   . Coronary artery disease Paternal Grandmother   . Colon cancer Paternal Grandfather   . Breast cancer Sister   . Colon polyps Mother     Social History Social History   Tobacco Use  . Smoking status: Never Smoker  . Smokeless tobacco: Never Used  Substance Use Topics  . Alcohol use: No    Alcohol/week: 0.0 standard drinks  . Drug use: No    Allergies  Allergen Reactions  . Codeine Sulfate     REACTION: Nausea    Current Outpatient Medications  Medication Sig Dispense Refill  .  benzonatate (TESSALON) 200 MG capsule Take 1 capsule (200 mg total) by mouth 3 (three) times daily as needed for cough. 60 capsule 0  . butalbital-acetaminophen-caffeine (FIORICET, ESGIC) 50-325-40 MG tablet TAKE ONE TABLET BY MOUTH EVERY 4 HOURS AS NEEDED FOR HEADACHE 30 tablet 0  . cetirizine (ZYRTEC) 10 MG tablet Take 10 mg by mouth daily as needed.      . doxycycline (VIBRA-TABS) 100 MG tablet Take 1 tablet (100 mg total) by mouth 2 (two) times daily. 14 tablet 0  . losartan-hydrochlorothiazide (HYZAAR) 100-25 MG tablet TAKE 1 TABLET DAILY 90 tablet 3  . Multiple Vitamin (MULTIVITAMIN) capsule Take 1 capsule by mouth daily.      Marland Kitchen omeprazole (PRILOSEC) 20 MG capsule TAKE 1 CAPSULE BY MOUTH TWICE DAILY AS DIRECTED BEFORE A MEAL 60 capsule 11  . potassium chloride SA (K-DUR,KLOR-CON) 20 MEQ tablet Take 1 tablet (20 mEq total) by mouth daily. 30 tablet 11  . predniSONE (DELTASONE) 20 MG tablet Take 2 tablets (40 mg total) by mouth daily. For 3 days, then 1 daily for 3 days 9 tablet 0  . sertraline (ZOLOFT) 50 MG tablet TAKE 1 TABLET DAILY 90 tablet 3  . triamcinolone cream (KENALOG) 0.1 % Apply 1 application topically 2 (two) times  daily as needed. 45 g 1   No current facility-administered medications for this visit.     Review of Systems Review of Systems  Constitutional: Negative.   Respiratory: Positive for cough.   Cardiovascular: Negative.     Blood pressure (!) 158/92, pulse 87, temperature 99 F (37.2 C), temperature source Skin, resp. rate 18, weight 172 lb (78 kg), SpO2 94 %.  Physical Exam Physical Exam Exam conducted with a chaperone present.  Constitutional:      Appearance: She is well-developed.  Eyes:     General: No scleral icterus.    Conjunctiva/sclera: Conjunctivae normal.  Neck:     Musculoskeletal: Neck supple.  Cardiovascular:     Rate and Rhythm: Normal rate and regular rhythm.     Heart sounds: Normal heart sounds.  Pulmonary:     Effort: Pulmonary  effort is normal.     Breath sounds: Normal breath sounds.  Chest:     Breasts:        Right: No inverted nipple, mass, nipple discharge, skin change or tenderness.        Left: No inverted nipple, mass, nipple discharge, skin change or tenderness.  Lymphadenopathy:     Cervical: No cervical adenopathy.  Skin:    General: Skin is warm and dry.  Neurological:     Mental Status: She is alert and oriented to person, place, and time.  Psychiatric:        Behavior: Behavior normal.     Data Reviewed Mammograms and or ultrasound examinations of the breast of Jan 14, 2018 through August 04, 2018 were reviewed. Most recent ultrasound suggested a 3 mm increase in size of the lesion at the 10 o'clock position of the right breast.   Ultrasound examination of the right breast was completed to confirm the area would be visible for ultrasound-guided biopsy here in the office.  In the 10 o'clock position of the right breast 6 cm from the nipple a well-defined hypoechoic mass with smooth borders measuring 0.73 x 1.04 x 1.16 cm is identified.  BI-RADS-3.  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    Plan    The patient desired to have the lesion removed rather than simply biopsied.  I think this can be accomplished with the Encor device.  Procedure was reviewed.   This will be scheduled at a convenient time for the patient.  Anticipate a quick return to work.  The patient is aware to call back for any questions or new concerns.      HPI, Physical Exam, Assessment and Plan have been scribed under the direction and in the presence of Tracy Bellow, MD. Tracy Fetch, RN  I have completed the exam and reviewed the above documentation for accuracy and completeness.  I agree with the above.  [name] has been used and any errors in dictation or transcription are unintentional.  [name], M.D., F.A.C.S.   [name] 08/08/2018, 10:56 AM

## 2018-08-06 NOTE — Patient Instructions (Addendum)
The patient is aware to call back for any questions or new concerns.   Recommend Encor biopsy of right breast mass in the office.

## 2018-08-07 ENCOUNTER — Ambulatory Visit: Payer: Managed Care, Other (non HMO)

## 2018-08-08 DIAGNOSIS — N6311 Unspecified lump in the right breast, upper outer quadrant: Secondary | ICD-10-CM | POA: Insufficient documentation

## 2018-08-11 ENCOUNTER — Ambulatory Visit (INDEPENDENT_AMBULATORY_CARE_PROVIDER_SITE_OTHER): Payer: Managed Care, Other (non HMO)

## 2018-08-11 ENCOUNTER — Encounter: Payer: Self-pay | Admitting: General Surgery

## 2018-08-11 ENCOUNTER — Other Ambulatory Visit: Payer: Self-pay

## 2018-08-11 ENCOUNTER — Ambulatory Visit: Payer: Managed Care, Other (non HMO) | Admitting: General Surgery

## 2018-08-11 VITALS — BP 110/64 | HR 84 | Resp 12 | Ht 64.0 in | Wt 171.0 lb

## 2018-08-11 DIAGNOSIS — N6311 Unspecified lump in the right breast, upper outer quadrant: Secondary | ICD-10-CM

## 2018-08-11 HISTORY — PX: BREAST BIOPSY: SHX20

## 2018-08-11 NOTE — Patient Instructions (Signed)

## 2018-08-11 NOTE — Progress Notes (Signed)
Patient ID: [name], female   DOB: 1971-07-04, 47 y.o.   MRN: [id number]  Chief Complaint  Patient presents with  . Procedure    HPI [name] is a 47 y.o. female here today for a right breast biopsy.  HPI  Past Medical History:  Diagnosis Date  . Allergy   . Breast fibroadenoma   . Diverticulitis   . GERD (gastroesophageal reflux disease)   . Hypertension   . Migraines   . Migraines   . Premenstrual dysphoric disorder     Past Surgical History:  Procedure Laterality Date  . BACK SURGERY  3/07   c6-c7 fused   . BREAST BIOPSY Left 2013   negative with clip  . BREAST EXCISIONAL BIOPSY Bilateral yrs ago   benign  . CERVICAL FUSION     C6-7  . DILATION AND CURETTAGE OF UTERUS  10/20/14  . TONSILLECTOMY    . Uterine ablation  5/16   Dr De Burrs  . wisdom teeth removal   1996    Family History  Problem Relation Age of Onset  . Coronary artery disease Father   . Hypertension Father   . Colon cancer Maternal Uncle   . Colon cancer Maternal Grandmother   . Coronary artery disease Paternal Grandmother   . Colon cancer Paternal Grandfather   . Breast cancer Sister   . Colon polyps Mother     Social History Social History   Tobacco Use  . Smoking status: Never Smoker  . Smokeless tobacco: Never Used  Substance Use Topics  . Alcohol use: No    Alcohol/week: 0.0 standard drinks  . Drug use: No    Allergies  Allergen Reactions  . Codeine Sulfate     REACTION: Nausea    Current Outpatient Medications  Medication Sig Dispense Refill  . butalbital-acetaminophen-caffeine (FIORICET, ESGIC) 50-325-40 MG tablet TAKE ONE TABLET BY MOUTH EVERY 4 HOURS AS NEEDED FOR HEADACHE 30 tablet 0  . cetirizine (ZYRTEC) 10 MG tablet Take 10 mg by mouth daily as needed.      Marland Kitchen losartan-hydrochlorothiazide (HYZAAR) 100-25 MG tablet TAKE 1 TABLET DAILY 90 tablet 3  . Multiple Vitamin (MULTIVITAMIN) capsule Take 1 capsule by mouth daily.      Marland Kitchen omeprazole (PRILOSEC)  20 MG capsule TAKE 1 CAPSULE BY MOUTH TWICE DAILY AS DIRECTED BEFORE A MEAL 60 capsule 11  . potassium chloride SA (K-DUR,KLOR-CON) 20 MEQ tablet Take 1 tablet (20 mEq total) by mouth daily. 30 tablet 11  . sertraline (ZOLOFT) 50 MG tablet TAKE 1 TABLET DAILY 90 tablet 3  . triamcinolone cream (KENALOG) 0.1 % Apply 1 application topically 2 (two) times daily as needed. 45 g 1   No current facility-administered medications for this visit.     Review of Systems Review of Systems  Constitutional: Negative.   Respiratory: Negative.   Cardiovascular: Negative.     Blood pressure 110/64, pulse 84, resp. rate 12, height 5\' 4"  (1.626 m), weight 171 lb (77.6 kg), SpO2 97 %.  Physical Exam Physical Exam Constitutional:      Appearance: Normal appearance.  Skin:    General: Skin is warm and dry.  Neurological:     Mental Status: She is alert and oriented to person, place, and time.  Psychiatric:        Mood and Affect: Mood normal.     Data Reviewed Ultrasound examination of the right breast at the 10 o'clock position, 6 cm from the nipple again showed a well-defined  hypoechoic nodule measuring up to 0.9 cm in diameter.  After reviewing the procedure she was amenable to Encor biopsy.  Skin was cleansed with ChloraPrep followed by the injection of 10 cc of 0.5% Xylocaine with 0.25% Marcaine with 1-200,000 notes of epinephrine.  ChloraPrep was again applied to the skin and a 10-gauge Encor device was placed in the center of the lesion making use of a lateral to medial approach..  Multiple core samples were obtained to completely remove the area to ultrasound imaging.  No bleeding noted.  A postbiopsy clip was placed.  Skin defect was closed with benzoin and Steri-Strip followed by Telfa and Tegaderm dressing.  Ice pack provided.  Postbiopsy instructions provided.  Assessment    Suspected fibroadenoma of the right breast, excised.    Plan    The patient will be contacted when pathology  is available.  Follow-up will be based on that study.     HPI, Physical Exam, Assessment and Plan have been scribed under the direction and in the presence of Robert Bellow, MD. [name], RN  I have completed the exam and reviewed the above documentation for accuracy and completeness.  I agree with the above.  Haematologist has been used and any errors in dictation or transcription are unintentional.  Hervey Ard, M.D., F.A.C.S.  [name] [name] 08/11/2018, 11:22 AM

## 2018-08-12 ENCOUNTER — Telehealth: Payer: Self-pay

## 2018-08-12 NOTE — Telephone Encounter (Signed)
-----   Message from Robert Bellow, MD sent at 08/12/2018 11:20 AM EST ----- Please notify biopsy confirmed clinical impression of a benign fibroadenoma.  Arrange f/u for six months. Thanks.  ----- Message ----- From: Interface, [facility] Sent: 08/12/2018  10:41 AM EST To: Robert Bellow, MD

## 2018-08-12 NOTE — Telephone Encounter (Signed)
Message left for patient to call for her results. Patient has been placed in recalls for a 6 month follow up with Dr Bary Castilla.

## 2018-08-13 NOTE — Telephone Encounter (Signed)
Notified patient as instructed, patient pleased. Discussed follow-up appointments, patient agrees  

## 2018-10-01 ENCOUNTER — Other Ambulatory Visit: Payer: Self-pay | Admitting: Internal Medicine

## 2018-10-27 ENCOUNTER — Ambulatory Visit: Payer: Managed Care, Other (non HMO) | Admitting: Family Medicine

## 2018-10-27 ENCOUNTER — Encounter: Payer: Self-pay | Admitting: Family Medicine

## 2018-10-27 VITALS — BP 120/82 | HR 80 | Temp 98.3°F | Ht 64.5 in | Wt 164.8 lb

## 2018-10-27 DIAGNOSIS — J069 Acute upper respiratory infection, unspecified: Secondary | ICD-10-CM

## 2018-10-27 DIAGNOSIS — R509 Fever, unspecified: Secondary | ICD-10-CM

## 2018-10-27 DIAGNOSIS — B9789 Other viral agents as the cause of diseases classified elsewhere: Secondary | ICD-10-CM | POA: Diagnosis not present

## 2018-10-27 LAB — POCT INFLUENZA A/B
INFLUENZA B, POC: NEGATIVE
Influenza A, POC: NEGATIVE

## 2018-10-27 MED ORDER — ALBUTEROL SULFATE HFA 108 (90 BASE) MCG/ACT IN AERS: 2.0000 | INHALATION_SPRAY | Freq: Four times a day (QID) | RESPIRATORY_TRACT | 0 refills | Status: DC | PRN

## 2018-10-27 NOTE — Patient Instructions (Signed)

## 2018-10-27 NOTE — Progress Notes (Signed)
Subjective:     [name] is a 48 y.o. female presenting for Fever (Started last night. Pt had body aches, chills,cough,sinus drainages. 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 The current episode started yesterday. The problem has been waxing and waning. The maximum temperature recorded prior to her arrival was 101 - 101.9 F. Associated symptoms include coughing and diarrhea. Pertinent negatives include no congestion, nausea, rhinorrhea, sinus pain, sneezing or vomiting. She has tried NSAIDs, sleep and increased fluids (nyquil) for the symptoms. The treatment provided mild relief.   Had bronchitis in November, and it finally improved with albuterol nebulizer treatments - 2 times a day  Husband has been sick, got sick on Saturday  Review of Systems  Constitutional: Positive for chills, fatigue and fever.  HENT: Positive for postnasal drip. Negative for congestion, rhinorrhea, sinus pressure, sinus pain and sneezing.   Respiratory: Positive for cough.   Gastrointestinal: Positive for diarrhea. Negative for nausea and vomiting.  Musculoskeletal: Positive for myalgias.   10/26/2018: MyChart - family member with flu like symtpoms and not being treated. wanting to see if tamiflu would help  Social History   Tobacco Use  Smoking Status Never Smoker  Smokeless Tobacco Never Used        Objective:    BP Readings from Last 3 Encounters:  10/27/18 120/82  08/11/18 110/64  08/06/18 (!) 158/92   Wt Readings from Last 3 Encounters:  10/27/18 164 lb 12 oz (74.7 kg)  08/11/18 171 lb (77.6 kg)  08/06/18 172 lb (78 kg)    BP 120/82   Pulse 80   Temp 98.3 F (36.8 C) (Oral)   Ht 5' 4.5" (1.638 m)   Wt 164 lb 12 oz (74.7 kg)   SpO2 95%   BMI 27.84 kg/m    Physical Exam Constitutional:      General: She is not in acute distress.    Appearance: She is well-developed. She is not diaphoretic.  HENT:     Head: Normocephalic and  atraumatic.     Right Ear: Tympanic membrane and ear canal normal.     Left Ear: Tympanic membrane and ear canal normal.     Nose: Mucosal edema and rhinorrhea present.     Right Sinus: No maxillary sinus tenderness or frontal sinus tenderness.     Left Sinus: No maxillary sinus tenderness or frontal sinus tenderness.     Mouth/Throat:     Pharynx: Uvula midline. Posterior oropharyngeal erythema present. No oropharyngeal exudate.     Tonsils: Swelling: 0 on the right. 0 on the left.  Eyes:     General: No scleral icterus.    Conjunctiva/sclera: Conjunctivae normal.  Neck:     Musculoskeletal: Neck supple.  Cardiovascular:     Rate and Rhythm: Normal rate and regular rhythm.     Heart sounds: Normal heart sounds. No murmur.  Pulmonary:     Effort: Pulmonary effort is normal. No respiratory distress.     Breath sounds: Normal breath sounds.  Lymphadenopathy:     Cervical: No cervical adenopathy.  Skin:    General: Skin is warm and dry.     Capillary Refill: Capillary refill takes less than 2 seconds.  Neurological:     Mental Status: She is alert.      Rapid flu: negative     Assessment & Plan:   Problem List Items Addressed This Visit  None    Visit Diagnoses    Viral URI with cough    -  Primary   Fever and chills       Relevant Orders   POCT Influenza A/B (Completed)     Flu negative Symptomatic care Albuterol prescribed as pt noted improvement with last cough with albuterol treatment   Return if symptoms worsen or fail to improve.  Lesleigh Noe, MD

## 2018-12-21 MED ORDER — BUTALBITAL-APAP-CAFFEINE 50-325-40 MG PO TABS: ORAL_TABLET | ORAL | 0 refills | Status: DC

## 2018-12-21 NOTE — Telephone Encounter (Signed)
Last filled 08-06-17 #30 Last OV 10-27-18 Acute Next OV 02-16-19 [facility]

## 2019-02-09 DIAGNOSIS — D2261 Melanocytic nevi of right upper limb, including shoulder: Secondary | ICD-10-CM | POA: Diagnosis not present

## 2019-02-09 DIAGNOSIS — D2272 Melanocytic nevi of left lower limb, including hip: Secondary | ICD-10-CM | POA: Diagnosis not present

## 2019-02-09 DIAGNOSIS — L720 Epidermal cyst: Secondary | ICD-10-CM | POA: Diagnosis not present

## 2019-02-09 DIAGNOSIS — D2271 Melanocytic nevi of right lower limb, including hip: Secondary | ICD-10-CM | POA: Diagnosis not present

## 2019-02-09 DIAGNOSIS — D2262 Melanocytic nevi of left upper limb, including shoulder: Secondary | ICD-10-CM | POA: Diagnosis not present

## 2019-02-09 DIAGNOSIS — D485 Neoplasm of uncertain behavior of skin: Secondary | ICD-10-CM | POA: Diagnosis not present

## 2019-02-09 DIAGNOSIS — D225 Melanocytic nevi of trunk: Secondary | ICD-10-CM | POA: Diagnosis not present

## 2019-02-16 ENCOUNTER — Other Ambulatory Visit: Payer: Self-pay

## 2019-02-16 ENCOUNTER — Ambulatory Visit (INDEPENDENT_AMBULATORY_CARE_PROVIDER_SITE_OTHER): Payer: 59 | Admitting: Internal Medicine

## 2019-02-16 ENCOUNTER — Encounter: Payer: Self-pay | Admitting: Internal Medicine

## 2019-02-16 VITALS — BP 110/80 | HR 79 | Temp 98.2°F | Ht 64.5 in | Wt 164.0 lb

## 2019-02-16 DIAGNOSIS — R69 Illness, unspecified: Secondary | ICD-10-CM | POA: Diagnosis not present

## 2019-02-16 DIAGNOSIS — I10 Essential (primary) hypertension: Secondary | ICD-10-CM | POA: Diagnosis not present

## 2019-02-16 DIAGNOSIS — M545 Low back pain, unspecified: Secondary | ICD-10-CM

## 2019-02-16 DIAGNOSIS — F39 Unspecified mood [affective] disorder: Secondary | ICD-10-CM | POA: Diagnosis not present

## 2019-02-16 DIAGNOSIS — K219 Gastro-esophageal reflux disease without esophagitis: Secondary | ICD-10-CM

## 2019-02-16 DIAGNOSIS — Z0000 Encounter for general adult medical examination without abnormal findings: Secondary | ICD-10-CM | POA: Diagnosis not present

## 2019-02-16 LAB — CBC
HCT: 40.4 % (ref 36.0–46.0)
Hemoglobin: 13.5 g/dL (ref 12.0–15.0)
MCHC: 33.5 g/dL (ref 30.0–36.0)
MCV: 84.3 fl (ref 78.0–100.0)
Platelets: 345 10*3/uL (ref 150.0–400.0)
RBC: 4.79 Mil/uL (ref 3.87–5.11)
RDW: 13.7 % (ref 11.5–15.5)
WBC: 5.8 10*3/uL (ref 4.0–10.5)

## 2019-02-16 LAB — COMPREHENSIVE METABOLIC PANEL
ALT: 19 U/L (ref 0–35)
AST: 16 U/L (ref 0–37)
Albumin: 3.9 g/dL (ref 3.5–5.2)
Alkaline Phosphatase: 80 U/L (ref 39–117)
BUN: 15 mg/dL (ref 6–23)
CO2: 32 mEq/L (ref 19–32)
Calcium: 8.5 mg/dL (ref 8.4–10.5)
Chloride: 101 mEq/L (ref 96–112)
Creatinine, Ser: 0.68 mg/dL (ref 0.40–1.20)
GFR: 91.64 mL/min (ref >60.00)
Glucose, Bld: 87 mg/dL (ref 70–99)
Potassium: 4 mEq/L (ref 3.5–5.1)
Sodium: 139 mEq/L (ref 135–145)
Total Bilirubin: 0.5 mg/dL (ref 0.2–1.2)
Total Protein: 6.3 g/dL (ref 6.0–8.3)

## 2019-02-16 LAB — T4, FREE: Free T4: 0.69 ng/dL (ref 0.60–1.60)

## 2019-02-16 NOTE — Assessment & Plan Note (Signed)
Chronic mild dysthymia doing well on sertraline

## 2019-02-16 NOTE — Assessment & Plan Note (Signed)
Will set up with Dr Copland 

## 2019-02-16 NOTE — Assessment & Plan Note (Signed)
Quiet on PPI

## 2019-02-16 NOTE — Assessment & Plan Note (Signed)
Healthy Colon due next year Mammogram will be redone/with surgeon visit--next year Discussed fitness Yearly flu vaccine Pap due 2022

## 2019-02-16 NOTE — Assessment & Plan Note (Signed)
BP Readings from Last 3 Encounters:  02/16/19 110/80  10/27/18 120/82  08/11/18 110/64   Good control Will do labs

## 2019-02-16 NOTE — Progress Notes (Signed)
Subjective:    Patient ID: [name], female    DOB: Jan 09, 1971, 48 y.o.   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 No radiation to legs. Feels muscle spasm No leg weakness Does do core strengthening and other exercise  Current Outpatient Medications on File Prior to Visit  Medication Sig Dispense Refill  . albuterol (PROVENTIL HFA;VENTOLIN HFA) 108 (90 Base) MCG/ACT inhaler Inhale 2 puffs into the lungs every 6 (six) hours as needed for wheezing or shortness of breath. 1 Inhaler 0  . butalbital-acetaminophen-caffeine (FIORICET) 50-325-40 MG tablet TAKE ONE TABLET BY MOUTH EVERY 4 HOURS AS NEEDED FOR HEADACHE 30 tablet 0  . cetirizine (ZYRTEC) 10 MG tablet Take 10 mg by mouth daily as needed.      . Glycerin-Polysorbate 80 (REFRESH DRY EYE THERAPY OP) Apply to eye.    . hydrochlorothiazide (HYDRODIURIL) 25 MG tablet TAKE 1 TABLET BY MOUTH DAILY 30 tablet 5  . losartan (COZAAR) 100 MG tablet TAKE 1 TABLET BY MOUTH DAILY 30 tablet 5  . losartan-hydrochlorothiazide (HYZAAR) 100-25 MG tablet TAKE 1 TABLET DAILY 90 tablet 3  . Multiple Vitamin (MULTIVITAMIN) capsule Take 1 capsule by mouth daily.      . naproxen sodium (ALEVE) 220 MG tablet Take 440 mg by mouth.    Marland Kitchen omeprazole (PRILOSEC) 20 MG capsule TAKE 1 CAPSULE BY MOUTH TWICE DAILY AS DIRECTED BEFORE A MEAL 60 capsule 11  . potassium chloride SA (K-DUR,KLOR-CON) 20 MEQ tablet Take 1 tablet (20 mEq total) by mouth daily. (Patient taking differently: Take 10 mEq by mouth daily. ) 30 tablet 11  . sertraline (ZOLOFT) 50 MG tablet TAKE 1 TABLET DAILY 90 tablet 3  . triamcinolone cream (KENALOG) 0.1 % Apply 1 application topically 2 (two) times daily as needed. 45 g 1   No  current facility-administered medications on file prior to visit.     Allergies  Allergen Reactions  . Codeine Sulfate     REACTION: Nausea    Past Medical History:  Diagnosis Date  . Allergy   . Breast fibroadenoma   . Diverticulitis   . GERD (gastroesophageal reflux disease)   . Hypertension   . Migraines   . Migraines   . Premenstrual dysphoric disorder     Past Surgical History:  Procedure Laterality Date  . BACK SURGERY  3/07   c6-c7 fused   . BREAST BIOPSY Left 2013   negative with clip  . BREAST EXCISIONAL BIOPSY Bilateral yrs ago   benign  . CERVICAL FUSION     C6-7  . DILATION AND CURETTAGE OF UTERUS  10/20/14  . TONSILLECTOMY    . Uterine ablation  5/16   Dr De Burrs  . wisdom teeth removal   1996    Family History  Problem Relation Age of Onset  . Coronary artery disease Father   . Hypertension Father   . Colon cancer Maternal Uncle   . Colon cancer Maternal Grandmother   . Coronary artery disease Paternal Grandmother   . Colon cancer Paternal Grandfather   . Breast cancer Sister   . Colon polyps Mother     Social History   Socioeconomic History  . Marital status: Married    Spouse name: Not  on file  . Number of children: 2  . Years of education: Not on file  . Highest education level: Not on file  Occupational History  . Occupation:  Merchandiser, retail  . Occupation:    Social Needs  . Financial resource strain: Not on file  . Food insecurity    Worry: Not on file    Inability: Not on file  . Transportation needs    Medical: Not on file    Non-medical: Not on file  Tobacco Use  . Smoking status: Never Smoker  . Smokeless tobacco: Never Used  Substance and Sexual Activity  . Alcohol use: No    Alcohol/week: 0.0 standard drinks  . Drug use: No  . Sexual activity: Not on file  Lifestyle  . Physical activity    Days per week: Not on file    Minutes per session: Not on file  . Stress: Not on file  Relationships  . Social  Herbalist on phone: Not on file    Gets together: Not on file    Attends religious service: Not on file    Active member of club or organization: Not on file    Attends meetings of clubs or organizations: Not on file    Relationship status: Not on file  . Intimate partner violence    Fear of current or ex partner: Not on file    Emotionally abused: Not on file    Physically abused: Not on file    Forced sexual activity: Not on file  Other Topics Concern  . Not on file  Social History Narrative   Divorced 2012--- remarried 2015   Review of Systems  Constitutional: Negative for fatigue and unexpected weight change.       Wears seat belt  HENT: Negative for dental problem, hearing loss, tinnitus and trouble swallowing.        Keeps up with dentist  Eyes: Negative for redness and visual disturbance.       No diplopia or unilateral vision loss  Respiratory: Negative for cough, chest tightness and shortness of breath.   Cardiovascular: Negative for chest pain, palpitations and leg swelling.  Gastrointestinal: Negative for blood in stool and constipation.       Heartburn controlled with the prilosec  Endocrine: Negative for polydipsia and polyuria.  Genitourinary: Negative for dyspareunia, dysuria and hematuria.  Musculoskeletal: Positive for back pain. Negative for arthralgias and joint swelling.  Skin: Negative for rash.       To derm last year---1 benign biopsy done  Allergic/Immunologic: Positive for environmental allergies. Negative for immunocompromised state.       Zyrtec works well  Neurological: Positive for headaches. Negative for dizziness, syncope and light-headedness.       Fioricet works---doesn't need often  Hematological: Negative for adenopathy. Does not bruise/bleed easily.  Psychiatric/Behavioral:       Mood okay on the sertraline No sleep issues other than her back       Objective:   Physical Exam  Constitutional: She is oriented to person, place,  and time. She appears well-developed. No distress.  HENT:  Head: Normocephalic and atraumatic.  Right Ear: External ear normal.  Left Ear: External ear normal.  Mouth/Throat: Oropharynx is clear and moist. No oropharyngeal exudate.  Eyes: Pupils are equal, round, and reactive to light. Conjunctivae are normal.  Neck: No thyromegaly present.  Cardiovascular: Normal rate, regular rhythm, normal heart sounds and intact distal pulses. Exam reveals no gallop.  No  murmur heard. Respiratory: Effort normal and breath sounds normal. No respiratory distress. She has no wheezes. She has no rales.  GI: Soft. There is no abdominal tenderness.  Musculoskeletal:        General: No tenderness or edema.  Lymphadenopathy:    She has no cervical adenopathy.  Neurological: She is alert and oriented to person, place, and time.  Skin: No rash noted. No erythema.  Psychiatric: She has a normal mood and affect. Her behavior is normal.           Assessment & Plan:

## 2019-02-23 NOTE — Progress Notes (Signed)
Tracy Mohler T. Agusta Hackenberg, MD [facility] [place] [name] [name], [id number] Phone: [phone]  FAX: [phone]  [name] - 48 y.o. female  MRN [id number]  Date of Birth: 28-Mar-1971  Visit Date: 02/24/2019  PCP: Tracy Carbon, MD  Referred by: Tracy Carbon, MD  Chief Complaint  Patient presents with  . Back Pain   Subjective:   [name] is a 48 y.o. very pleasant female patient who presents with the following:  [name] is here today for evaluation courtesy of Dr. Silvio Wilson for ongoing back pain: She has a 20-year history of back pain, this is worsened over the last 2 years, but it progressively has been worsening since its initial onset.  Working back pain for a couple of years.  [name] been complaining about it for at least twenty years.  At the beginning thought may be GYN related.  Probably all MSK and gradually.   Did have a c6--7 fusion.  [name].  Goes to the chiropractor now every month.  Not the last couple of months.  Increased the frequency.  She is an avid exerciser.  3-4 times a week.  Has had to change over time.  Did do some HIIT type exercises.  Does a lot of lower intensity.  This is such as treadmill, bicycle, or elliptical.  Does wear a brace with working out.   Takes some alleve.  Every motning.  Uses a heating paid.  Every day.   No numbness.  No ESI.   No PT in > 10 years.  Some massage.   3+ DTR  PT No neuropathic meds.  LIDOCAINE PATCHES? Works out of [employer] / Pilates?  Past Medical History, Surgical History, Social History, Family History, Problem List, Medications, and Allergies have been reviewed and updated if relevant.  Patient Active Problem List   Diagnosis Date Noted  . Mass of upper outer quadrant of right breast 08/08/2018  . Dysphagia, pharyngoesophageal phase 01/25/2015  . Hypertension   . Fibrocystic breast disease  01/06/2014  . Family history of breast cancer 01/06/2014  . Family history of colon cancer 01/06/2014  . GERD (gastroesophageal reflux disease)   . Migraines   . Routine general medical examination at a health care facility 05/15/2011  . ALLERGIC RHINITIS 04/21/2008  . LOW BACK PAIN SYNDROME 01/28/2008  . DIVERTICULOSIS, COLON 12/17/2006  . Mild mood disorder ([name]) 12/17/2006    Past Medical History:  Diagnosis Date  . Allergy   . Breast fibroadenoma   . Diverticulitis   . GERD (gastroesophageal reflux disease)   . Hypertension   . Migraines   . Migraines   . Premenstrual dysphoric disorder     Past Surgical History:  Procedure Laterality Date  . BACK SURGERY  3/07   c6-c7 fused   . BREAST BIOPSY Left 2013   negative with clip  . BREAST EXCISIONAL BIOPSY Bilateral yrs ago   benign  . CERVICAL FUSION     C6-7  . DILATION AND CURETTAGE OF UTERUS  10/20/14  . TONSILLECTOMY    . Uterine ablation  5/16   Dr De Burrs  . wisdom teeth removal   1996    Social History   Socioeconomic History  . Marital status: Married    Spouse name: Not on file  . Number of children: 2  . Years of education: Not on file  . Highest education level: Not on  file  Occupational History  . Occupation:  Merchandiser, retail  . Occupation:    Social Needs  . Financial resource strain: Not on file  . Food insecurity    Worry: Not on file    Inability: Not on file  . Transportation needs    Medical: Not on file    Non-medical: Not on file  Tobacco Use  . Smoking status: Never Smoker  . Smokeless tobacco: Never Used  Substance and Sexual Activity  . Alcohol use: No    Alcohol/week: 0.0 standard drinks  . Drug use: No  . Sexual activity: Not on file  Lifestyle  . Physical activity    Days per week: Not on file    Minutes per session: Not on file  . Stress: Not on file  Relationships  . Social Herbalist on phone: Not on file    Gets together: Not on file    Attends  religious service: Not on file    Active member of club or organization: Not on file    Attends meetings of clubs or organizations: Not on file    Relationship status: Not on file  . Intimate partner violence    Fear of current or ex partner: Not on file    Emotionally abused: Not on file    Physically abused: Not on file    Forced sexual activity: Not on file  Other Topics Concern  . Not on file  Social History Narrative   Divorced 2012--- remarried 2015    Family History  Problem Relation Age of Onset  . Coronary artery disease Father   . Hypertension Father   . Colon cancer Maternal Uncle   . Colon cancer Maternal Grandmother   . Coronary artery disease Paternal Grandmother   . Colon cancer Paternal Grandfather   . Breast cancer Sister   . Colon polyps Mother     Allergies  Allergen Reactions  . Codeine Sulfate     REACTION: Nausea    Medication list reviewed and updated in full in [place].  GEN: No fevers, chills. Nontoxic. Primarily MSK c/o today. MSK: Detailed in the HPI GI: tolerating PO intake without difficulty Neuro: No numbness, parasthesias, or tingling associated. Otherwise the pertinent positives of the ROS are noted above.   Objective:   BP 120/82   Pulse 81   Temp 98.7 F (37.1 C) (Oral)   Ht 5' 4.5" (1.638 m)   Wt 166 lb 8 oz (75.5 kg)   SpO2 97%   BMI 28.14 kg/m    GEN: Well-developed,well-nourished,in no acute distress; alert,appropriate and cooperative throughout examination HEENT: Normocephalic and atraumatic without obvious abnormalities. Ears, externally no deformities PULM: Breathing comfortably in no respiratory distress EXT: No clubbing, cyanosis, or edema PSYCH: Normally interactive. Cooperative during the interview. Pleasant. Friendly and conversant. Not anxious or depressed appearing. Normal, full affect.  Range of motion at  the waist: Flexion: normal Extension: normal Lateral bending: normal Rotation: all normal   No echymosis or edema Rises to examination table with no difficulty Gait: non antalgic  Inspection/Deformity: N Paraspinus Tenderness: L1-s1, tight  B Ankle Dorsiflexion (L5,4): 5/5 B Great Toe Dorsiflexion (L5,4): 5/5 Heel Walk (L5): WNL Toe Walk (S1): WNL Rise/Squat (L4): WNL  SENSORY B Medial Foot (L4): WNL B Dorsum (L5): WNL B Lateral (S1): WNL Light Touch: WNL Pinprick: WNL  REFLEXES Knee (L4): 2+ Ankle (S1): 2+  B SLR, seated: neg B SLR, supine: neg B FABER: neg B  Reverse FABER: neg B Greater Troch: NT B Log Roll: neg B Stork: NT B Sciatic Notch: NT   Radiology: No results found.   Assessment and Plan:     ICD-10-CM   1. Chronic bilateral low back pain without sciatica  M54.5 DG Lumbar Spine Complete   G89.29 Ambulatory referral to Physical Therapy   Her back exam and plain films are fairly unremarkable.  Chronic musculoskeletal back pain.  No radiculopathy.  We reviewed various types of nonoperative management.  For now, I would start with conservative things that she has not done.  Hold neuropathic pain meds unless she has no improvement.  Patient Instructions  Things to try:  PT  Lidocaine 4% cream or patches  Swimming regularly - pool classes can also help  Yoga and pilates have good evidence for chronic back pain.  Neuropathic pain medication like Gabapentin or Lyrica    Follow-up: 2 mo  No orders of the defined types were placed in this encounter.  Orders Placed This Encounter  Procedures  . DG Lumbar Spine Complete  . Ambulatory referral to Physical Therapy    Signed,  Frederico Hamman T. Aashika Carta, MD   Outpatient Encounter Medications as of 02/24/2019  Medication Sig  . albuterol (PROVENTIL HFA;VENTOLIN HFA) 108 (90 Base) MCG/ACT inhaler Inhale 2 puffs into the lungs every 6 (six) hours as needed for wheezing or shortness of breath.  . butalbital-acetaminophen-caffeine (FIORICET) 50-325-40 MG tablet TAKE ONE TABLET BY MOUTH EVERY 4  HOURS AS NEEDED FOR HEADACHE  . cetirizine (ZYRTEC) 10 MG tablet Take 10 mg by mouth daily as needed.    . Glycerin-Polysorbate 80 (REFRESH DRY EYE THERAPY OP) Apply to eye.  . hydrochlorothiazide (HYDRODIURIL) 25 MG tablet TAKE 1 TABLET BY MOUTH DAILY  . losartan (COZAAR) 100 MG tablet TAKE 1 TABLET BY MOUTH DAILY  . losartan-hydrochlorothiazide (HYZAAR) 100-25 MG tablet TAKE 1 TABLET DAILY  . Multiple Vitamin (MULTIVITAMIN) capsule Take 1 capsule by mouth daily.    . naproxen sodium (ALEVE) 220 MG tablet Take 440 mg by mouth.  Marland Kitchen omeprazole (PRILOSEC) 20 MG capsule TAKE 1 CAPSULE BY MOUTH TWICE DAILY AS DIRECTED BEFORE A MEAL  . potassium chloride SA (K-DUR,KLOR-CON) 20 MEQ tablet Take 1 tablet (20 mEq total) by mouth daily. (Patient taking differently: Take 10 mEq by mouth daily. )  . sertraline (ZOLOFT) 50 MG tablet TAKE 1 TABLET DAILY  . triamcinolone cream (KENALOG) 0.1 % Apply 1 application topically 2 (two) times daily as needed.   No facility-administered encounter medications on file as of 02/24/2019.

## 2019-02-24 ENCOUNTER — Ambulatory Visit: Payer: 59 | Admitting: Family Medicine

## 2019-02-24 ENCOUNTER — Other Ambulatory Visit: Payer: Self-pay

## 2019-02-24 ENCOUNTER — Ambulatory Visit (INDEPENDENT_AMBULATORY_CARE_PROVIDER_SITE_OTHER)
Admission: RE | Admit: 2019-02-24 | Discharge: 2019-02-24 | Disposition: A | Discharge status: Home / Self Care | Payer: 59 | Source: Ambulatory Visit | Attending: Family Medicine | Admitting: Family Medicine

## 2019-02-24 ENCOUNTER — Encounter: Payer: Self-pay | Admitting: Family Medicine

## 2019-02-24 VITALS — BP 120/82 | HR 81 | Temp 98.7°F | Ht 64.5 in | Wt 166.5 lb

## 2019-02-24 DIAGNOSIS — G8929 Other chronic pain: Secondary | ICD-10-CM

## 2019-02-24 DIAGNOSIS — M545 Low back pain: Secondary | ICD-10-CM | POA: Diagnosis not present

## 2019-02-24 NOTE — Patient Instructions (Addendum)
Things to try:  PT  Lidocaine 4% cream or patches  Swimming regularly - pool classes can also help  Yoga and pilates have good evidence for chronic back pain.  Neuropathic pain medication like Gabapentin or Lyrica

## 2019-03-01 ENCOUNTER — Other Ambulatory Visit: Payer: Self-pay | Admitting: Internal Medicine

## 2019-03-02 DIAGNOSIS — M545 Low back pain: Secondary | ICD-10-CM | POA: Diagnosis not present

## 2019-03-02 DIAGNOSIS — M4316 Spondylolisthesis, lumbar region: Secondary | ICD-10-CM | POA: Diagnosis not present

## 2019-03-02 DIAGNOSIS — M4156 Other secondary scoliosis, lumbar region: Secondary | ICD-10-CM | POA: Diagnosis not present

## 2019-03-08 ENCOUNTER — Encounter: Payer: Self-pay | Admitting: General Surgery

## 2019-03-09 ENCOUNTER — Telehealth: Payer: Self-pay | Admitting: *Deleted

## 2019-03-09 DIAGNOSIS — M4156 Other secondary scoliosis, lumbar region: Secondary | ICD-10-CM | POA: Diagnosis not present

## 2019-03-09 DIAGNOSIS — M4316 Spondylolisthesis, lumbar region: Secondary | ICD-10-CM | POA: Diagnosis not present

## 2019-03-09 DIAGNOSIS — M545 Low back pain: Secondary | ICD-10-CM | POA: Diagnosis not present

## 2019-03-09 NOTE — Telephone Encounter (Signed)
Patient called to cancel 6 month follow up that was scheduled for 03-11-19.  The patient had a breast biopsy completed in 07-2018-fibroadenoma.   The patient reports that she is not having any issues at this time and wants to know if she could just be placed in the recalls for December 2020 and have Dr. Bary Castilla order her mammogram at that time.   Note routed to Dr. Bary Castilla.

## 2019-03-11 ENCOUNTER — Ambulatory Visit: Payer: 59 | Admitting: General Surgery

## 2019-03-22 ENCOUNTER — Other Ambulatory Visit: Payer: Self-pay | Admitting: Internal Medicine

## 2019-03-23 DIAGNOSIS — M4316 Spondylolisthesis, lumbar region: Secondary | ICD-10-CM | POA: Diagnosis not present

## 2019-03-23 DIAGNOSIS — M4156 Other secondary scoliosis, lumbar region: Secondary | ICD-10-CM | POA: Diagnosis not present

## 2019-03-23 DIAGNOSIS — M545 Low back pain: Secondary | ICD-10-CM | POA: Diagnosis not present

## 2019-03-30 DIAGNOSIS — M4156 Other secondary scoliosis, lumbar region: Secondary | ICD-10-CM | POA: Diagnosis not present

## 2019-03-30 DIAGNOSIS — M545 Low back pain: Secondary | ICD-10-CM | POA: Diagnosis not present

## 2019-03-30 DIAGNOSIS — M4316 Spondylolisthesis, lumbar region: Secondary | ICD-10-CM | POA: Diagnosis not present

## 2019-04-05 ENCOUNTER — Encounter: Payer: Self-pay | Admitting: Family Medicine

## 2019-04-05 ENCOUNTER — Ambulatory Visit (INDEPENDENT_AMBULATORY_CARE_PROVIDER_SITE_OTHER): Payer: 59 | Admitting: Family Medicine

## 2019-04-05 VITALS — Wt 163.0 lb

## 2019-04-05 DIAGNOSIS — L03818 Cellulitis of other sites: Secondary | ICD-10-CM | POA: Diagnosis not present

## 2019-04-05 MED ORDER — CEPHALEXIN 500 MG PO CAPS: 500.0000 mg | ORAL_CAPSULE | Freq: Three times a day (TID) | ORAL | 0 refills | Status: DC

## 2019-04-05 NOTE — Progress Notes (Signed)
Virtual Visit via Video Note  I connected with [name] on 04/05/19 at 11:00 AM EDT by a video enabled telemedicine application and verified that I am speaking with the correct person using two identifiers.  Location: Patient: In her home Provider: [place] discussed the limitations of evaluation and management by telemedicine and the availability of in person appointments. The patient expressed understanding and agreed to proceed.  History of Present Illness: Chief Complaint  Patient presents with  . Fever    Fever 100.7 and body aches. Pt denies congestion and cough. Pt  feels that her fever is maybe coming from an infection - pt was shaving underarms on Saturday and possibly knicked herself and now her left breast is red, tender and inflamed.    This is a 14 up female who requests virtual visit today for fever and body aches x 2 days. She nicked her underarm on Saturday and now has redness, tenderness around left breast. No history of MRSA.  She denies any focal areas, no drainage, no nipple discharge.  She does not palpate any masses in her breast or left axilla.  Never has had similar prior. Had mammogram and negative right breast bx 12/19. No respiratory symptoms.  She has taken Tylenol with reduction of fever.  Past Medical History:  Diagnosis Date  . Allergy   . Breast fibroadenoma   . Diverticulitis   . GERD (gastroesophageal reflux disease)   . Hypertension   . Migraines   . Migraines   . Premenstrual dysphoric disorder    Past Surgical History:  Procedure Laterality Date  . BACK SURGERY  3/07   c6-c7 fused   . BREAST BIOPSY Left 2013   negative with clip  . BREAST EXCISIONAL BIOPSY Bilateral yrs ago   benign  . CERVICAL FUSION     C6-7  . DILATION AND CURETTAGE OF UTERUS  10/20/14  . TONSILLECTOMY    . Uterine ablation  5/16   Dr De Burrs  . wisdom teeth removal   1996   Family History  Problem Relation Age of Onset  . Coronary  artery disease Father   . Hypertension Father   . Colon cancer Maternal Uncle   . Colon cancer Maternal Grandmother   . Coronary artery disease Paternal Grandmother   . Colon cancer Paternal Grandfather   . Breast cancer Sister   . Colon polyps Mother    Social History   Tobacco Use  . Smoking status: Never Smoker  . Smokeless tobacco: Never Used  Substance Use Topics  . Alcohol use: No    Alcohol/week: 0.0 standard drinks  . Drug use: No      Observations/Objective: Patient is alert and answers questions appropriately.  She is normally conversive without shortness of breath, audible wheeze or witnessed cough.  Video quality is not great but I am able to see a little bit of flat appearing streaking erythema across left breast.  Mood and affect are appropriate. Wt 163 lb (73.9 kg)   BMI 27.55 kg/m  Wt Readings from Last 3 Encounters:  04/05/19 163 lb (73.9 kg)  02/24/19 166 lb 8 oz (75.5 kg)  02/16/19 164 lb (74.4 kg)   BP Readings from Last 3 Encounters:  02/24/19 120/82  02/16/19 110/80  10/27/18 120/82    Assessment and Plan: 1. Cellulitis of other specified site -Discussed diagnosis with patient and will go ahead and start her on course of antibiotic. -She was instructed to notify the  office if symptoms worsened or if she had no improvement after 24 to 36 hours on antibiotic -Discussed possible need for diagnostic mammogram if symptoms do not completely resolve -Over-the-counter analgesics as needed for pain/fever - cephALEXin (KEFLEX) 500 MG capsule; Take 1 capsule (500 mg total) by mouth 3 (three) times daily.  Dispense: 21 capsule; Refill: 0   Clarene Reamer, FNP-BC  [facility]  04/05/2019 11:24 AM   Follow Up Instructions: Visit recap sent to patient via my chart   I discussed the assessment and treatment plan with the patient. The patient was provided an opportunity to ask questions and all were  answered. The patient agreed with the plan and demonstrated an understanding of the instructions.   The patient was advised to call back or seek an in-person evaluation if the symptoms worsen or if the condition fails to improve as anticipated.  Elby Beck, FNP

## 2019-04-13 DIAGNOSIS — M4316 Spondylolisthesis, lumbar region: Secondary | ICD-10-CM | POA: Diagnosis not present

## 2019-04-13 DIAGNOSIS — M545 Low back pain: Secondary | ICD-10-CM | POA: Diagnosis not present

## 2019-04-13 DIAGNOSIS — M4156 Other secondary scoliosis, lumbar region: Secondary | ICD-10-CM | POA: Diagnosis not present

## 2019-05-21 ENCOUNTER — Encounter: Payer: Self-pay | Admitting: *Deleted

## 2019-05-24 ENCOUNTER — Other Ambulatory Visit: Payer: Self-pay | Admitting: Internal Medicine

## 2019-09-08 ENCOUNTER — Other Ambulatory Visit: Payer: Self-pay

## 2019-09-08 ENCOUNTER — Telehealth: Payer: Self-pay | Admitting: *Deleted

## 2019-09-08 DIAGNOSIS — Z1231 Encounter for screening mammogram for malignant neoplasm of breast: Secondary | ICD-10-CM

## 2019-09-08 NOTE — Telephone Encounter (Signed)
Patient called and wants to follow up with one of our surgeons for a f/u mammogram she wants a call back about this as well, she stated that she wants a wednesday appt since she is off that day.

## 2019-09-08 NOTE — Telephone Encounter (Signed)
Order placed and patient scheduled for mammogram and follow up office visit here.

## 2019-09-09 NOTE — Telephone Encounter (Signed)
Please see this note and try to find out what happened. That is not good practice--he should have been able to get insulin over the holiday

## 2019-09-13 NOTE — Telephone Encounter (Signed)
Dr. Silvio Pate,  I am forwarding this feedback to Lorenda Peck (clinical manager) to review.  Thank you for making me aware. The [facility] handled this call, we will have this fully investigated.    Thanks.

## 2019-10-13 ENCOUNTER — Ambulatory Visit: Payer: 59 | Admitting: Surgery

## 2019-10-27 ENCOUNTER — Ambulatory Visit
Admission: RE | Admit: 2019-10-27 | Discharge: 2019-10-27 | Disposition: A | Discharge status: Home / Self Care | Payer: 59 | Source: Ambulatory Visit | Attending: Surgery | Admitting: Surgery

## 2019-10-27 DIAGNOSIS — Z1231 Encounter for screening mammogram for malignant neoplasm of breast: Secondary | ICD-10-CM | POA: Insufficient documentation

## 2019-11-03 ENCOUNTER — Encounter: Payer: Self-pay | Admitting: Surgery

## 2019-11-03 ENCOUNTER — Other Ambulatory Visit: Payer: Self-pay

## 2019-11-03 ENCOUNTER — Ambulatory Visit: Payer: 59 | Admitting: Surgery

## 2019-11-03 NOTE — Patient Instructions (Signed)
Breast Self-Awareness Breast self-awareness means being familiar with how your breasts look and feel. It involves checking your breasts regularly and reporting any changes to your health care provider. Practicing breast self-awareness is important. Sometimes changes may not be harmful (are benign), but sometimes a change in your breasts can be a sign of a serious medical problem. It is important to learn how to do this procedure correctly so that you can catch problems early, when treatment is more likely to be successful. All women should practice breast self-awareness, including women who have had breast implants. What you need:  A mirror.  A well-lit room. How to do a breast self-exam A breast self-exam is one way to learn what is normal for your breasts and whether your breasts are changing. To do a breast self-exam: Look for changes  1. Remove all the clothing above your waist. 2. Stand in front of a mirror in a room with good lighting. 3. Put your hands on your hips. 4. Push your hands firmly downward. 5. Compare your breasts in the mirror. Look for differences between them (asymmetry), such as: ? Differences in shape. ? Differences in size. ? Puckers, dips, and bumps in one breast and not the other. 6. Look at each breast for changes in the skin, such as: ? Redness. ? Scaly areas. 7. Look for changes in your nipples, such as: ? Discharge. ? Bleeding. ? Dimpling. ? Redness. ? A change in position. Feel for changes Carefully feel your breasts for lumps and changes. It is best to do this while lying on your back on the floor, and again while sitting or standing in the tub or shower with soapy water on your skin. Feel each breast in the following way: 1. Place the arm on the side of the breast you are examining above your head. 2. Feel your breast with the other hand. 3. Start in the nipple area and make -inch (2 cm) overlapping circles to feel your breast. Use the pads of your  three middle fingers to do this. Apply light pressure, then medium pressure, then firm pressure. The light pressure will allow you to feel the tissue closest to the skin. The medium pressure will allow you to feel the tissue that is a little deeper. The firm pressure will allow you to feel the tissue close to the ribs. 4. Continue the overlapping circles, moving downward over the breast until you feel your ribs below your breast. 5. Move one finger-width toward the center of the body. Continue to use the -inch (2 cm) overlapping circles to feel your breast as you move slowly up toward your collarbone. 6. Continue the up-and-down exam using all three pressures until you reach your armpit.  Write down what you find Writing down what you find can help you remember what to discuss with your health care provider. Write down:  What is normal for each breast.  Any changes that you find in each breast, including: ? The kind of changes you find. ? Any pain or tenderness. ? Size and location of any lumps.  Where you are in your menstrual cycle, if you are still menstruating. General tips and recommendations  Examine your breasts every month.  If you are breastfeeding, the best time to examine your breasts is after a feeding or after using a breast pump.  If you menstruate, the best time to examine your breasts is 5-7 days after your period. Breasts are generally lumpier during menstrual periods, and it may  be more difficult to notice changes.  With time and practice, you will become more familiar with the variations in your breasts and more comfortable with the exam. Contact a health care provider if you:  See a change in the shape or size of your breasts or nipples.  See a change in the skin of your breast or nipples, such as a reddened or scaly area.  Have unusual discharge from your nipples.  Find a lump or thick area that was not there before.  Have pain in your breasts.  Have any  concerns related to your breast health. Summary  Breast self-awareness includes looking for physical changes in your breasts, as well as feeling for any changes within your breasts.  Breast self-awareness should be performed in front of a mirror in a well-lit room.  You should examine your breasts every month. If you menstruate, the best time to examine your breasts is 5-7 days after your menstrual period.  Let your health care provider know of any changes you notice in your breasts, including changes in size, changes on the skin, pain or tenderness, or unusual fluid from your nipples. This information is not intended to replace advice given to you by your health care provider. Make sure you discuss any questions you have with your health care provider. Document Revised: 03/31/2018 Document Reviewed: 03/31/2018 Elsevier Patient Education  2020 [employer] Inc.  

## 2019-11-10 ENCOUNTER — Other Ambulatory Visit: Payer: Self-pay

## 2019-11-10 ENCOUNTER — Encounter: Payer: Self-pay | Admitting: Surgery

## 2019-11-10 ENCOUNTER — Ambulatory Visit: Payer: 59 | Admitting: Surgery

## 2019-11-10 VITALS — BP 128/81 | HR 86 | Temp 97.2°F | Resp 12 | Ht 64.0 in | Wt 171.4 lb

## 2019-11-10 DIAGNOSIS — N6311 Unspecified lump in the right breast, upper outer quadrant: Secondary | ICD-10-CM

## 2019-11-10 NOTE — Patient Instructions (Addendum)
You will hear from our office around February 2022 to schedule your mammogram and office visit for March 2022.   Please call the office if you have any questions or concerns.   Breast Self-Awareness Breast self-awareness is knowing how your breasts look and feel. Doing breast self-awareness is important. It allows you to catch a breast problem early while it is still small and can be treated. All women should do breast self-awareness, including women who have had breast implants. Tell your doctor if you notice a change in your breasts. What you need:  A mirror.  A well-lit room. How to do a breast self-exam A breast self-exam is one way to learn what is normal for your breasts and to check for changes. To do a breast self-exam: Look for changes  1. Take off all the clothes above your waist. 2. Stand in front of a mirror in a room with good lighting. 3. Put your hands on your hips. 4. Push your hands down. 5. Look at your breasts and nipples in the mirror to see if one breast or nipple looks different from the other. Check to see if: ? The shape of one breast is different. ? The size of one breast is different. ? There are wrinkles, dips, and bumps in one breast and not the other. 6. Look at each breast for changes in the skin, such as: ? Redness. ? Scaly areas. 7. Look for changes in your nipples, such as: ? Liquid around the nipples. ? Bleeding. ? Dimpling. ? Redness. ? A change in where the nipples are. Feel for changes  1. Lie on your back on the floor. 2. Feel each breast. To do this, follow these steps: ? Pick a breast to feel. ? Put the arm closest to that breast above your head. ? Use your other arm to feel the nipple area of your breast. Feel the area with the pads of your three middle fingers by making small circles with your fingers. For the first circle, press lightly. For the second circle, press harder. For the third circle, press even harder. ? Keep making circles  with your fingers at the different pressures as you move down your breast. Stop when you feel your ribs. ? Move your fingers a little toward the center of your body. ? Start making circles with your fingers again, this time going up until you reach your collarbone. ? Keep making up-and-down circles until you reach your armpit. Remember to keep using the three pressures. ? Feel the other breast in the same way. 3. Sit or stand in the tub or shower. 4. With soapy water on your skin, feel each breast the same way you did in step 2 when you were lying on the floor. Write down what you find Writing down what you find can help you remember what to tell your doctor. Write down:  What is normal for each breast.  Any changes you find in each breast, including: ? The kind of changes you find. ? Whether you have pain. ? Size and location of any lumps.  When you last had your menstrual period. General tips  Check your breasts every month.  If you are breastfeeding, the best time to check your breasts is after you feed your baby or after you use a breast pump.  If you get menstrual periods, the best time to check your breasts is 5-7 days after your menstrual period is over.  With time, you will become comfortable  with the self-exam, and you will begin to know if there are changes in your breasts. Contact a doctor if you:  See a change in the shape or size of your breasts or nipples.  See a change in the skin of your breast or nipples, such as red or scaly skin.  Have fluid coming from your nipples that is not normal.  Find a lump or thick area that was not there before.  Have pain in your breasts.  Have any concerns about your breast health. Summary  Breast self-awareness includes looking for changes in your breasts, as well as feeling for changes within your breasts.  Breast self-awareness should be done in front of a mirror in a well-lit room.  You should check your breasts every  month. If you get menstrual periods, the best time to check your breasts is 5-7 days after your menstrual period is over.  Let your doctor know of any changes you see in your breasts, including changes in size, changes on the skin, pain or tenderness, or fluid from your nipples that is not normal. This information is not intended to replace advice given to you by your health care provider. Make sure you discuss any questions you have with your health care provider. Document Revised: 03/31/2018 Document Reviewed: 03/31/2018 Elsevier Patient Education  [place].

## 2019-11-11 NOTE — Progress Notes (Signed)
Outpatient Surgical Follow Up  11/11/2019  [name] is an 49 y.o. female.   Chief Complaint  Patient presents with  . 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  She now comes for yearly physical exam.  I have personally reviewed her mammogram showing no evidence of any suspicious lesions.  She denies any breast lesions.  No fevers no chills no breast discharge.  No weight loss. 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 Jamal Collin in 2013. Does have significant history of colorectal cancer in her family.  She does have an upcoming colonoscopy. He does have 2 sisters with diagnosis of breast cancer.  Past Medical History:  Diagnosis Date  . Allergy   . Breast fibroadenoma   . Diverticulitis   . GERD (gastroesophageal reflux disease)   . Hypertension   . Migraines   . Migraines   . Premenstrual dysphoric disorder     Past Surgical History:  Procedure Laterality Date  . BACK SURGERY  3/07   c6-c7 fused   . BREAST BIOPSY Left 2013   negative with clip  . BREAST BIOPSY Right 08/11/2018   BX with Dr. Bary Castilla, ribbon marker, FIBROADENOMA WITH FOCAL USUAL DUCT EPITHELIAL HYPERPLASIA  . BREAST EXCISIONAL BIOPSY Bilateral yrs ago   benign  . CERVICAL FUSION     C6-7  . DILATION AND CURETTAGE OF UTERUS  10/20/14  . TONSILLECTOMY    . Uterine ablation  5/16   Dr De Burrs  . wisdom teeth removal   1996    Family History  Problem Relation Age of Onset  . Coronary artery disease Father   . Hypertension Father   . Colon cancer Maternal Uncle   . Colon cancer Maternal Grandmother   . Coronary artery disease Paternal Grandmother   . Colon cancer Paternal Grandfather   . Breast cancer Sister 74  . Colon polyps Mother     Social History:  reports that she has never smoked. She has never used  smokeless tobacco. She reports that she does not drink alcohol or use drugs.  Allergies:  Allergies  Allergen Reactions  . Codeine Sulfate     REACTION: Nausea    Medications reviewed.    ROS Full ROS performed and is otherwise negative other than what is stated in HPI   BP 128/81   Pulse 86   Temp (!) 97.2 F (36.2 C)   Resp 12   Ht 5\' 4"  (1.626 m)   Wt 171 lb 6.4 oz (77.7 kg)   SpO2 94%   BMI 29.42 kg/m   Physical Exam Vitals and nursing note reviewed. Exam conducted with a chaperone present.  Constitutional:      General: She is not in acute distress.    Appearance: Normal appearance. She is normal weight.  Eyes:     General: No scleral icterus.       Right eye: No discharge.        Left eye: No discharge.  Cardiovascular:     Rate and Rhythm: Normal rate and regular rhythm.     Heart sounds: No murmur.  Pulmonary:     Effort: Pulmonary effort is normal. No respiratory distress.     Breath sounds: Normal breath sounds. No stridor.     Comments: Breast:  no palpable masses on either breast, nml skin and nipples. No DC. No LAD Left and right lumpectomy scars. Abdominal:     General: Abdomen is flat. There is no distension.     Palpations: There is no mass.     Tenderness: There is no abdominal tenderness. There is no guarding or rebound.     Hernia: No hernia is present.  Musculoskeletal:        General: No swelling. Normal range of motion.     Cervical back: Normal range of motion and neck supple. No rigidity or tenderness.  Skin:    General: Skin is warm and dry.     Capillary Refill: Capillary refill takes less than 2 seconds.  Neurological:     General: No focal deficit present.     Mental Status: She is alert and oriented to person, place, and time.  Psychiatric:        Mood and Affect: Mood normal.        Behavior: Behavior normal.        Thought Content: Thought content normal.        Judgment: Judgment normal.          Assessment/Plan: 23-year-old female with a history of right fibroadenoma.  There is no evidence of any new concerning lesions on physical exam nor imaging studies.  We will continue yearly exams with mammograms.  No need for any surgical intervention at this time. She does have an upcoming colonoscopy given her significant history of colon cancer in the family Greater than 50% of the 25 minutes  visit was spent in counseling/coordination of care   Caroleen Hamman, MD [facility]

## 2019-12-08 ENCOUNTER — Ambulatory Visit: Payer: 59 | Admitting: Internal Medicine

## 2019-12-08 ENCOUNTER — Encounter: Payer: Self-pay | Admitting: Internal Medicine

## 2019-12-08 ENCOUNTER — Other Ambulatory Visit: Payer: Self-pay

## 2019-12-08 DIAGNOSIS — R8290 Unspecified abnormal findings in urine: Secondary | ICD-10-CM

## 2019-12-08 DIAGNOSIS — J3489 Other specified disorders of nose and nasal sinuses: Secondary | ICD-10-CM

## 2019-12-08 LAB — POC URINALSYSI DIPSTICK (AUTOMATED)
Bilirubin, UA: NEGATIVE
Blood, UA: NEGATIVE
Glucose, UA: NEGATIVE
Ketones, UA: NEGATIVE
Leukocytes, UA: NEGATIVE
Nitrite, UA: NEGATIVE
Protein, UA: NEGATIVE
Spec Grav, UA: 1.015 (ref 1.010–1.025)
Urobilinogen, UA: 0.2 E.U./dL
pH, UA: 7 (ref 5.0–8.0)

## 2019-12-08 NOTE — Addendum Note (Signed)
Addended by: Pilar Grammes on: 12/08/2019 12:55 PM   Modules accepted: Orders

## 2019-12-08 NOTE — Progress Notes (Signed)
Subjective:    Patient ID: [name], female    DOB: Jul 03, 1971, 49 y.o.   MRN: [id number]  HPI Here due to a few concerns--including urine odor This visit occurred during the SARS-CoV-2 public health emergency.  Safety protocols were in place, including screening questions prior to the visit, additional usage of staff PPE, and extensive cleaning of exam room while observing appropriate contact time as indicated for disinfecting solutions.   Has had urine odor for over a month No other symptoms---no dysuria, hematuria, abdominal pain, fever, etc No new meds or supplements or foods  Now using the albuterol before exercise  She has a sore in her left nare Comes and goes--will even be tender if she touches the outside It comes and goes Now is bleeding  Having pain in left thumb Points at [facility] joint Can hurt grabbing a pot, etc Fairly recently--- some months Started voltaren cream bid---no clear help  Current Outpatient Medications on File Prior to Visit  Medication Sig Dispense Refill  . albuterol (PROVENTIL HFA;VENTOLIN HFA) 108 (90 Base) MCG/ACT inhaler Inhale 2 puffs into the lungs every 6 (six) hours as needed for wheezing or shortness of breath. 1 Inhaler 0  . aspirin (ASPIRIN 81) 81 MG EC tablet     . butalbital-acetaminophen-caffeine (FIORICET) 50-325-40 MG tablet TAKE ONE TABLET BY MOUTH EVERY 4 HOURS AS NEEDED FOR HEADACHE 30 tablet 0  . cetirizine (ZYRTEC) 10 MG tablet Take 10 mg by mouth daily as needed.      . Glycerin-Polysorbate 80 (REFRESH DRY EYE THERAPY OP) Apply to eye.    . hydrochlorothiazide (HYDRODIURIL) 25 MG tablet TAKE ONE TABLET EVERY DAY 90 tablet 3  . losartan (COZAAR) 100 MG tablet TAKE ONE TABLET EVERY DAY 90 tablet 3  . losartan-hydrochlorothiazide (HYZAAR) 100-25 MG tablet TAKE 1 TABLET DAILY 90 tablet 3  . Multiple Vitamin (MULTIVITAMIN) capsule Take 1 capsule by mouth daily.      . naproxen sodium (ALEVE) 220 MG tablet Take 440 mg by mouth.    Marland Kitchen  omeprazole (PRILOSEC) 20 MG capsule TAKE 1 CAPSULE BY MOUTH TWICE DAILY AS DIRECTED BEFORE A MEAL 180 capsule 3  . potassium chloride SA (K-DUR) 20 MEQ tablet Take 0.5 tablets (10 mEq total) by mouth daily. 90 tablet 3  . sertraline (ZOLOFT) 50 MG tablet TAKE ONE TABLET BY MOUTH EVERY DAY 90 tablet 3  . triamcinolone cream (KENALOG) 0.1 % Apply 1 application topically 2 (two) times daily as needed. 45 g 1   No current facility-administered medications on file prior to visit.    Allergies  Allergen Reactions  . Codeine Sulfate     REACTION: Nausea    Past Medical History:  Diagnosis Date  . Allergy   . Breast fibroadenoma   . Diverticulitis   . GERD (gastroesophageal reflux disease)   . Hypertension   . Migraines   . Migraines   . Premenstrual dysphoric disorder     Past Surgical History:  Procedure Laterality Date  . BACK SURGERY  3/07   c6-c7 fused   . BREAST BIOPSY Left 2013   negative with clip  . BREAST BIOPSY Right 08/11/2018   BX with Dr. Bary Castilla, ribbon marker, FIBROADENOMA WITH FOCAL USUAL DUCT EPITHELIAL HYPERPLASIA  . BREAST EXCISIONAL BIOPSY Bilateral yrs ago   benign  . CERVICAL FUSION     C6-7  . DILATION AND CURETTAGE OF UTERUS  10/20/14  . TONSILLECTOMY    . Uterine ablation  5/16  Dr De Burrs  . wisdom teeth removal   1996    Family History  Problem Relation Age of Onset  . Coronary artery disease Father   . Hypertension Father   . Colon cancer Maternal Uncle   . Colon cancer Maternal Grandmother   . Coronary artery disease Paternal Grandmother   . Colon cancer Paternal Grandfather   . Breast cancer Sister 7  . Colon polyps Mother     Social History   Socioeconomic History  . Marital status: Married    Spouse name: Not on file  . Number of children: 2  . Years of education: Not on file  . Highest education level: Not on file  Occupational History  . Occupation:  Merchandiser, retail  . Occupation:    Tobacco Use  . Smoking  status: Never Smoker  . Smokeless tobacco: Never Used  Substance and Sexual Activity  . Alcohol use: No    Alcohol/week: 0.0 standard drinks  . Drug use: No  . Sexual activity: Not on file  Other Topics Concern  . Not on file  Social History Narrative   Divorced 2012--- remarried 2015   Social Determinants of Health   Financial Resource Strain:   . Difficulty of Paying Living Expenses:   Food Insecurity:   . Worried About Charity fundraiser in the Last Year:   . Arboriculturist in the Last Year:   Transportation Needs:   . Film/video editor (Medical):   Marland Kitchen Lack of Transportation (Non-Medical):   Physical Activity:   . Days of Exercise per Week:   . Minutes of Exercise per Session:   Stress:   . Feeling of Stress :   Social Connections:   . Frequency of Communication with Friends and Family:   . Frequency of Social Gatherings with Friends and Family:   . Attends Religious Services:   . Active Member of Clubs or Organizations:   . Attends Archivist Meetings:   Marland Kitchen Marital Status:   Intimate Partner Violence:   . Fear of Current or Ex-Partner:   . Emotionally Abused:   Marland Kitchen Physically Abused:   . Sexually Abused:       Review of Systems     Objective:   Physical Exam  Constitutional: No distress.  HENT:  No inflammation or clear mass in left nostril May have small polyp there  Musculoskeletal:     Comments: Mild tenderness at left CMC---but no weakness or joint swelling           Assessment & Plan:

## 2019-12-08 NOTE — Assessment & Plan Note (Signed)
Urinalysis completely normal No obvious substance to cause this Discussed trying cranberry tabs or probiotic to see if it helps

## 2019-12-08 NOTE — Assessment & Plan Note (Signed)
No worrisome lesion but may have small polyp Can try flonase for a while---if no better or ongoing bleeding, she will set up with ENT

## 2020-02-08 DIAGNOSIS — D2262 Melanocytic nevi of left upper limb, including shoulder: Secondary | ICD-10-CM | POA: Diagnosis not present

## 2020-02-08 DIAGNOSIS — D2261 Melanocytic nevi of right upper limb, including shoulder: Secondary | ICD-10-CM | POA: Diagnosis not present

## 2020-02-08 DIAGNOSIS — L821 Other seborrheic keratosis: Secondary | ICD-10-CM | POA: Diagnosis not present

## 2020-02-08 DIAGNOSIS — D225 Melanocytic nevi of trunk: Secondary | ICD-10-CM | POA: Diagnosis not present

## 2020-02-08 DIAGNOSIS — D2271 Melanocytic nevi of right lower limb, including hip: Secondary | ICD-10-CM | POA: Diagnosis not present

## 2020-02-08 DIAGNOSIS — D2272 Melanocytic nevi of left lower limb, including hip: Secondary | ICD-10-CM | POA: Diagnosis not present

## 2020-02-21 ENCOUNTER — Other Ambulatory Visit: Payer: Self-pay | Admitting: Internal Medicine

## 2020-02-22 ENCOUNTER — Ambulatory Visit (INDEPENDENT_AMBULATORY_CARE_PROVIDER_SITE_OTHER): Payer: 59 | Admitting: Internal Medicine

## 2020-02-22 ENCOUNTER — Encounter: Payer: Self-pay | Admitting: Internal Medicine

## 2020-02-22 ENCOUNTER — Other Ambulatory Visit: Payer: Self-pay

## 2020-02-22 VITALS — BP 116/80 | HR 78 | Ht 64.25 in | Wt 172.0 lb

## 2020-02-22 DIAGNOSIS — Z0000 Encounter for general adult medical examination without abnormal findings: Secondary | ICD-10-CM | POA: Diagnosis not present

## 2020-02-22 DIAGNOSIS — K2100 Gastro-esophageal reflux disease with esophagitis, without bleeding: Secondary | ICD-10-CM

## 2020-02-22 DIAGNOSIS — Z1211 Encounter for screening for malignant neoplasm of colon: Secondary | ICD-10-CM | POA: Diagnosis not present

## 2020-02-22 DIAGNOSIS — F39 Unspecified mood [affective] disorder: Secondary | ICD-10-CM

## 2020-02-22 DIAGNOSIS — R69 Illness, unspecified: Secondary | ICD-10-CM | POA: Diagnosis not present

## 2020-02-22 DIAGNOSIS — I10 Essential (primary) hypertension: Secondary | ICD-10-CM | POA: Diagnosis not present

## 2020-02-22 LAB — COMPREHENSIVE METABOLIC PANEL
ALT: 23 U/L (ref 0–35)
AST: 19 U/L (ref 0–37)
Albumin: 4.1 g/dL (ref 3.5–5.2)
Alkaline Phosphatase: 91 U/L (ref 39–117)
BUN: 16 mg/dL (ref 6–23)
CO2: 31 mEq/L (ref 19–32)
Calcium: 9 mg/dL (ref 8.4–10.5)
Chloride: 100 mEq/L (ref 96–112)
Creatinine, Ser: 0.71 mg/dL (ref 0.40–1.20)
GFR: 86.83 mL/min (ref >60.00)
Glucose, Bld: 88 mg/dL (ref 70–99)
Potassium: 3.8 mEq/L (ref 3.5–5.1)
Sodium: 139 mEq/L (ref 135–145)
Total Bilirubin: 0.4 mg/dL (ref 0.2–1.2)
Total Protein: 7 g/dL (ref 6.0–8.3)

## 2020-02-22 LAB — CBC
HCT: 39.9 % (ref 36.0–46.0)
Hemoglobin: 13.5 g/dL (ref 12.0–15.0)
MCHC: 33.8 g/dL (ref 30.0–36.0)
MCV: 83.8 fl (ref 78.0–100.0)
Platelets: 327 10*3/uL (ref 150.0–400.0)
RBC: 4.76 Mil/uL (ref 3.87–5.11)
RDW: 13.8 % (ref 11.5–15.5)
WBC: 9.1 10*3/uL (ref 4.0–10.5)

## 2020-02-22 NOTE — Progress Notes (Signed)
Subjective:    Patient ID: [name], female    DOB: [birth year], 49 y.o.   MRN: [id number]  HPI Here for physical This visit occurred during the SARS-CoV-2 public health emergency.  Safety protocols were in place, including screening questions prior to the visit, additional usage of staff PPE, and extensive cleaning of exam room while observing appropriate contact time as indicated for disinfecting solutions.   Was having hot flashes Using doterra women product (soy) and this is helping  Not really checking BP Still regular exercise  Current Outpatient Medications on File Prior to Visit  Medication Sig Dispense Refill  . albuterol (PROVENTIL HFA;VENTOLIN HFA) 108 (90 Base) MCG/ACT inhaler Inhale 2 puffs into the lungs every 6 (six) hours as needed for wheezing or shortness of breath. 1 Inhaler 0  . aspirin (ASPIRIN 81) 81 MG EC tablet     . butalbital-acetaminophen-caffeine (FIORICET) 50-325-40 MG tablet TAKE ONE TABLET BY MOUTH EVERY 4 HOURS AS NEEDED FOR HEADACHE 30 tablet 0  . cetirizine (ZYRTEC) 10 MG tablet Take 10 mg by mouth daily as needed.      . Glycerin-Polysorbate 80 (REFRESH DRY EYE THERAPY OP) Apply to eye.    . hydrochlorothiazide (HYDRODIURIL) 25 MG tablet TAKE ONE TABLET BY MOUTH EVERY DAY 90 tablet 3  . losartan (COZAAR) 100 MG tablet TAKE ONE TABLET EVERY DAY 90 tablet 3  . losartan-hydrochlorothiazide (HYZAAR) 100-25 MG tablet TAKE 1 TABLET DAILY 90 tablet 3  . melatonin 5 MG TABS     . Multiple Vitamin (MULTIVITAMIN) capsule Take 1 capsule by mouth daily.      . naproxen sodium (ALEVE) 220 MG tablet Take 440 mg by mouth.    . NON FORMULARY Phytoestrogen 2 tablets daily    . omeprazole (PRILOSEC) 20 MG capsule TAKE 1 CAPSULE BY MOUTH TWICE DAILY AS DIRECTED BEFORE A MEAL 180 capsule 3  . potassium chloride SA (K-DUR) 20 MEQ tablet Take 0.5 tablets (10 mEq total) by mouth daily. 90 tablet 3  . sertraline (ZOLOFT) 50 MG tablet TAKE ONE TABLET BY MOUTH EVERY DAY 90  tablet 3  . triamcinolone cream (KENALOG) 0.1 % Apply 1 application topically 2 (two) times daily as needed. 45 g 1   No current facility-administered medications on file prior to visit.    Allergies  Allergen Reactions  . Codeine Sulfate     REACTION: Nausea    Past Medical History:  Diagnosis Date  . Allergy   . Breast fibroadenoma   . Diverticulitis   . GERD (gastroesophageal reflux disease)   . Hypertension   . Migraines   . Migraines   . Premenstrual dysphoric disorder     Past Surgical History:  Procedure Laterality Date  . BACK SURGERY  3/07   c6-c7 fused   . BREAST BIOPSY Left 2013   negative with clip  . BREAST BIOPSY Right 08/11/2018   BX with Dr. Bary Castilla, ribbon marker, FIBROADENOMA WITH FOCAL USUAL DUCT EPITHELIAL HYPERPLASIA  . BREAST EXCISIONAL BIOPSY Bilateral yrs ago   benign  . CERVICAL FUSION     C6-7  . DILATION AND CURETTAGE OF UTERUS  10/20/14  . TONSILLECTOMY    . Uterine ablation  5/16   Dr De Burrs  . wisdom teeth removal   1996    Family History  Problem Relation Age of Onset  . Coronary artery disease Father   . Hypertension Father   . Colon cancer Maternal Uncle   . Colon cancer Maternal Grandmother   .  Coronary artery disease Paternal Grandmother   . Colon cancer Paternal Grandfather   . Breast cancer Sister 1  . Colon polyps Mother     Social History   Socioeconomic History  . Marital status: Married    Spouse name: Not on file  . Number of children: 2  . Years of education: Not on file  . Highest education level: Not on file  Occupational History  . Occupation:  Merchandiser, retail  . Occupation:    Tobacco Use  . Smoking status: Never Smoker  . Smokeless tobacco: Never Used  Substance and Sexual Activity  . Alcohol use: No    Alcohol/week: 0.0 standard drinks  . Drug use: No  . Sexual activity: Not on file  Other Topics Concern  . Not on file  Social History Narrative   Divorced 2012--- remarried 2015    Social Determinants of Health   Financial Resource Strain:   . Difficulty of Paying Living Expenses:   Food Insecurity:   . Worried About Charity fundraiser in the Last Year:   . Arboriculturist in the Last Year:   Transportation Needs:   . Film/video editor (Medical):   Marland Kitchen Lack of Transportation (Non-Medical):   Physical Activity:   . Days of Exercise per Week:   . Minutes of Exercise per Session:   Stress:   . Feeling of Stress :   Social Connections:   . Frequency of Communication with Friends and Family:   . Frequency of Social Gatherings with Friends and Family:   . Attends Religious Services:   . Active Member of Clubs or Organizations:   . Attends Archivist Meetings:   Marland Kitchen Marital Status:   Intimate Partner Violence:   . Fear of Current or Ex-Partner:   . Emotionally Abused:   Marland Kitchen Physically Abused:   . Sexually Abused:    Review of Systems  Constitutional: Negative for fatigue and unexpected weight change.       Wears seat belt  HENT: Negative for dental problem, hearing loss and tinnitus.        Has gotten back in with dentist  Eyes: Negative for visual disturbance.       No diplopia or unilateral vision loss  Respiratory: Positive for shortness of breath. Negative for cough and chest tightness.        SOB better with pretreatment with inhaler before exercise  Cardiovascular: Negative for palpitations and leg swelling.       Slight chest pains---?GERD. No symptoms with exercise  Gastrointestinal: Negative for blood in stool and constipation.       Some heartburn---mild dysphagia but better on prilosec  Endocrine: Negative for polydipsia and polyuria.  Genitourinary: Negative for dyspareunia, dysuria and hematuria.       Cranberry helps odor---but she has to continue   Musculoskeletal: Negative for arthralgias and joint swelling.       Chronic back pain---monthly alignments with chiropractor have helped ([facility])  Skin: Negative for rash.        No suspicious lesions Recent annual check with derm  Allergic/Immunologic: Negative for environmental allergies and immunocompromised state.  Neurological: Negative for dizziness, syncope and light-headedness.       Not much headaches lately  Hematological: Negative for adenopathy. Does not bruise/bleed easily.  Psychiatric/Behavioral:       Mood is okay on the sertraline Sleeping better with melatonin and soy       Objective:   Physical Exam Constitutional:  General: She is not in acute distress.    Appearance: Normal appearance.  HENT:     Head: Normocephalic and atraumatic.     Right Ear: Tympanic membrane and ear canal normal.     Left Ear: Tympanic membrane and ear canal normal.     Mouth/Throat:     Comments: No oral lesions Eyes:     Conjunctiva/sclera: Conjunctivae normal.     Pupils: Pupils are equal, round, and reactive to light.  Cardiovascular:     Rate and Rhythm: Normal rate and regular rhythm.     Pulses: Normal pulses.     Heart sounds: No murmur heard.  No gallop.   Pulmonary:     Effort: Pulmonary effort is normal.     Breath sounds: Normal breath sounds. No wheezing or rales.  Abdominal:     Palpations: Abdomen is soft.     Tenderness: There is abdominal tenderness.  Musculoskeletal:     Cervical back: Neck supple.     Right lower leg: No edema.     Left lower leg: No edema.  Lymphadenopathy:     Cervical: No cervical adenopathy.  Skin:    Findings: No rash.  Neurological:     General: No focal deficit present.     Mental Status: She is alert.  Psychiatric:        Mood and Affect: Mood normal.        Behavior: Behavior normal.            Assessment & Plan:

## 2020-02-22 NOTE — Assessment & Plan Note (Signed)
Has done well with sertraline Will continue

## 2020-02-22 NOTE — Assessment & Plan Note (Signed)
Doing okay Still some symptoms and mild dysphagia May need EGD along with colon

## 2020-02-22 NOTE — Assessment & Plan Note (Signed)
BP Readings from Last 3 Encounters:  02/22/20 116/80  12/08/19 140/90  11/10/19 128/81   Good control Will continue meds Check labs

## 2020-02-22 NOTE — Assessment & Plan Note (Signed)
Healthy Exercises regularly Had COVID vaccine Prefers no flu Pap due next year Will set up with GI for colon mammo yearly--due next March

## 2020-02-29 ENCOUNTER — Other Ambulatory Visit: Payer: Self-pay | Admitting: *Deleted

## 2020-02-29 NOTE — Patient Outreach (Signed)
[facility]) Care Management  02/29/2020  ESTORIA GEARY [birth year] [id number]   Referral received from care management assistant to screen member for [employer] HTN initiative.  Call placed to member, no answer, HIPAA compliant voice message left.  Will send unsuccessful outreach letter and follow up within the next 3-4 business days.  Valente David, [place], MSN [place] [phone]

## 2020-03-03 ENCOUNTER — Other Ambulatory Visit: Payer: Self-pay | Admitting: *Deleted

## 2020-03-03 NOTE — Patient Outreach (Signed)
[facility]) Care Management  03/03/2020  [name] 09-Feb-1971 [id number]   Outreach attempt #2, unsuccessful.  Referral received from care management assistant to screen member for [employer] HTN initiative.  Call placed to member, no answer, HIPAA compliant voice message left.  Will follow up within the next 3-4 business days.  Valente David, [place], MSN [place] [phone]

## 2020-03-09 ENCOUNTER — Ambulatory Visit: Payer: 59 | Admitting: Internal Medicine

## 2020-03-09 ENCOUNTER — Other Ambulatory Visit: Payer: Self-pay | Admitting: *Deleted

## 2020-03-09 ENCOUNTER — Encounter: Payer: Self-pay | Admitting: Internal Medicine

## 2020-03-09 ENCOUNTER — Other Ambulatory Visit: Payer: Self-pay

## 2020-03-09 ENCOUNTER — Ambulatory Visit (INDEPENDENT_AMBULATORY_CARE_PROVIDER_SITE_OTHER)
Admission: RE | Admit: 2020-03-09 | Discharge: 2020-03-09 | Disposition: A | Discharge status: Home / Self Care | Payer: 59 | Source: Ambulatory Visit | Attending: Internal Medicine | Admitting: Internal Medicine

## 2020-03-09 DIAGNOSIS — M7989 Other specified soft tissue disorders: Secondary | ICD-10-CM | POA: Diagnosis not present

## 2020-03-09 DIAGNOSIS — S99922A Unspecified injury of left foot, initial encounter: Secondary | ICD-10-CM | POA: Diagnosis not present

## 2020-03-09 DIAGNOSIS — M79672 Pain in left foot: Secondary | ICD-10-CM | POA: Diagnosis not present

## 2020-03-09 NOTE — Patient Outreach (Signed)
[facility]) Care Management  03/09/2020  KYNZIE POLGAR 09-04-1970 [id number]   Outreach attempt #3, unsuccessful.  Referral received from care management assistant to screen member for [employer] HTN initiative.  Call placed to member, no answer, HIPAA compliant voice message left.  Will follow up within the next 4 weeks per workflow.  If remain unsuccessful, will close case at that time.  Valente David, [place], MSN [place] [phone]

## 2020-03-09 NOTE — Progress Notes (Signed)
Subjective:    Patient ID: [name], female    DOB: [birth year], 49 y.o.   MRN: [id number]  HPI Here with husband due to left foot injury This visit occurred during the SARS-CoV-2 public health emergency.  Safety protocols were in place, including screening questions prior to the visit, additional usage of staff PPE, and extensive cleaning of exam room while observing appropriate contact time as indicated for disinfecting solutions.   Stepped into shower--right foot "shot out" Left foot everted and twisted under her Stood up--took a quick shower  Marked swelling started quickly Blue/purple discoloration Has been walking around with wheeled office chair  Iced it regularly and elevation Ibuprofen 400mg  x 1  Current Outpatient Medications on File Prior to Visit  Medication Sig Dispense Refill  . albuterol (PROVENTIL HFA;VENTOLIN HFA) 108 (90 Base) MCG/ACT inhaler Inhale 2 puffs into the lungs every 6 (six) hours as needed for wheezing or shortness of breath. 1 Inhaler 0  . aspirin (ASPIRIN 81) 81 MG EC tablet     . butalbital-acetaminophen-caffeine (FIORICET) 50-325-40 MG tablet TAKE ONE TABLET BY MOUTH EVERY 4 HOURS AS NEEDED FOR HEADACHE 30 tablet 0  . cetirizine (ZYRTEC) 10 MG tablet Take 10 mg by mouth daily as needed.      . Glycerin-Polysorbate 80 (REFRESH DRY EYE THERAPY OP) Apply to eye.    . hydrochlorothiazide (HYDRODIURIL) 25 MG tablet TAKE ONE TABLET BY MOUTH EVERY DAY 90 tablet 3  . losartan (COZAAR) 100 MG tablet TAKE ONE TABLET EVERY DAY 90 tablet 3  . losartan-hydrochlorothiazide (HYZAAR) 100-25 MG tablet TAKE 1 TABLET DAILY 90 tablet 3  . melatonin 5 MG TABS     . Multiple Vitamin (MULTIVITAMIN) capsule Take 1 capsule by mouth daily.      . naproxen sodium (ALEVE) 220 MG tablet Take 440 mg by mouth.    . NON FORMULARY Phytoestrogen 2 tablets daily    . omeprazole (PRILOSEC) 20 MG capsule TAKE 1 CAPSULE BY MOUTH TWICE DAILY AS DIRECTED BEFORE A MEAL 180 capsule 3    . potassium chloride SA (K-DUR) 20 MEQ tablet Take 0.5 tablets (10 mEq total) by mouth daily. 90 tablet 3  . sertraline (ZOLOFT) 50 MG tablet TAKE ONE TABLET BY MOUTH EVERY DAY 90 tablet 3  . triamcinolone cream (KENALOG) 0.1 % Apply 1 application topically 2 (two) times daily as needed. 45 g 1   No current facility-administered medications on file prior to visit.    Allergies  Allergen Reactions  . Codeine Sulfate     REACTION: Nausea    Past Medical History:  Diagnosis Date  . Allergy   . Breast fibroadenoma   . Diverticulitis   . GERD (gastroesophageal reflux disease)   . Hypertension   . Migraines   . Migraines   . Premenstrual dysphoric disorder     Past Surgical History:  Procedure Laterality Date  . BACK SURGERY  3/07   c6-c7 fused   . BREAST BIOPSY Left 2013   negative with clip  . BREAST BIOPSY Right 08/11/2018   BX with Dr. Bary Castilla, ribbon marker, FIBROADENOMA WITH FOCAL USUAL DUCT EPITHELIAL HYPERPLASIA  . BREAST EXCISIONAL BIOPSY Bilateral yrs ago   benign  . CERVICAL FUSION     C6-7  . DILATION AND CURETTAGE OF UTERUS  10/20/14  . TONSILLECTOMY    . Uterine ablation  5/16   Dr De Burrs  . wisdom teeth removal   1996    Family History  Problem Relation Age  of Onset  . Coronary artery disease Father   . Hypertension Father   . Colon cancer Maternal Uncle   . Colon cancer Maternal Grandmother   . Coronary artery disease Paternal Grandmother   . Colon cancer Paternal Grandfather   . Breast cancer Sister 66  . Colon polyps Mother     Social History   Socioeconomic History  . Marital status: Married    Spouse name: Not on file  . Number of children: 2  . Years of education: Not on file  . Highest education level: Not on file  Occupational History  . Occupation:  Merchandiser, retail  . Occupation:    Tobacco Use  . Smoking status: Never Smoker  . Smokeless tobacco: Never Used  Substance and Sexual Activity  . Alcohol use: No     Alcohol/week: 0.0 standard drinks  . Drug use: No  . Sexual activity: Not on file  Other Topics Concern  . Not on file  Social History Narrative   Divorced 2012--- remarried 2015   Social Determinants of Health   Financial Resource Strain:   . Difficulty of Paying Living Expenses:   Food Insecurity:   . Worried About Charity fundraiser in the Last Year:   . Arboriculturist in the Last Year:   Transportation Needs:   . Film/video editor (Medical):   Marland Kitchen Lack of Transportation (Non-Medical):   Physical Activity:   . Days of Exercise per Week:   . Minutes of Exercise per Session:   Stress:   . Feeling of Stress :   Social Connections:   . Frequency of Communication with Friends and Family:   . Frequency of Social Gatherings with Friends and Family:   . Attends Religious Services:   . Active Member of Clubs or Organizations:   . Attends Archivist Meetings:   Marland Kitchen Marital Status:   Intimate Partner Violence:   . Fear of Current or Ex-Partner:   . Emotionally Abused:   Marland Kitchen Physically Abused:   . Sexually Abused:    Review of Systems  No other injury in the fall     Objective:   Physical Exam Constitutional:      Appearance: Normal appearance.  Musculoskeletal:     Comments: Marked ecchymotic swelling across entire left foot No ankle tenderness and passive ROM fine No clear plantar tenderness  Neurological:     Mental Status: She is alert.            Assessment & Plan:

## 2020-03-09 NOTE — Assessment & Plan Note (Addendum)
Unclear if all soft tissue or bony injury Will check x-ray  X-ray negative Will give crutches Ice regularly x 48 hours Elastic wrap when swelling goes down No weight bearing till pain is better

## 2020-03-10 ENCOUNTER — Other Ambulatory Visit: Payer: Self-pay | Admitting: *Deleted

## 2020-03-10 NOTE — Patient Outreach (Signed)
[facility]) Care Management  03/10/2020  [name] [birth year] [id number]   Call received back from member.  This care manager introduced self and explained [facility] care management services.  [employer] HTN initiative discussed, she declines participation.  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  She has been able to manage her conditions independently.  Will not open case at this time.    Valente David, [place], MSN [place] [phone]

## 2020-03-20 ENCOUNTER — Other Ambulatory Visit: Payer: Self-pay | Admitting: Internal Medicine

## 2020-03-27 ENCOUNTER — Other Ambulatory Visit: Payer: Self-pay | Admitting: Internal Medicine

## 2020-04-06 ENCOUNTER — Ambulatory Visit: Payer: Self-pay | Admitting: *Deleted

## 2020-04-18 ENCOUNTER — Encounter: Payer: Self-pay | Admitting: Internal Medicine

## 2020-04-18 ENCOUNTER — Telehealth (INDEPENDENT_AMBULATORY_CARE_PROVIDER_SITE_OTHER): Payer: 59 | Admitting: Internal Medicine

## 2020-04-18 DIAGNOSIS — J0140 Acute pansinusitis, unspecified: Secondary | ICD-10-CM | POA: Diagnosis not present

## 2020-04-18 NOTE — Telephone Encounter (Signed)
I left a message on patient's voice mail to return my call.

## 2020-04-18 NOTE — Telephone Encounter (Signed)
Please add her on as a video visit at noon if she can do that

## 2020-04-18 NOTE — Telephone Encounter (Signed)
Patient scheduled virtual visit with Dr.Letvak today at 12:00.

## 2020-04-18 NOTE — Progress Notes (Signed)
Subjective:    Patient ID: [name], female    DOB: Sep 07, 1970, 49 y.o.   MRN: [id number]  HPI Video virtual visit due to sinus symptoms Identification done Reviewed limitations and billing and she gave consent Participants--patient in her home and I am in my office  Her mom tested positive on August 15th---staying with her She is improving some finally Currently still in quarantine--but hasn't been tested  Started with sinus symptoms 4 days ago Lots of congestion and pressure Low grade fever---only 99 Some post nasal drip with some cough No SOB  Current Outpatient Medications on File Prior to Visit  Medication Sig Dispense Refill  . aspirin (ASPIRIN 81) 81 MG EC tablet     . butalbital-acetaminophen-caffeine (FIORICET) 50-325-40 MG tablet TAKE ONE TABLET BY MOUTH EVERY 4 HOURS AS NEEDED FOR HEADACHE 30 tablet 0  . cetirizine (ZYRTEC) 10 MG tablet Take 10 mg by mouth daily as needed.      . Glycerin-Polysorbate 80 (REFRESH DRY EYE THERAPY OP) Apply to eye.    . hydrochlorothiazide (HYDRODIURIL) 25 MG tablet TAKE ONE TABLET BY MOUTH EVERY DAY 90 tablet 3  . losartan (COZAAR) 100 MG tablet TAKE ONE TABLET BY MOUTH EVERY DAY 90 tablet 3  . losartan-hydrochlorothiazide (HYZAAR) 100-25 MG tablet TAKE 1 TABLET DAILY 90 tablet 3  . melatonin 5 MG TABS     . Multiple Vitamin (MULTIVITAMIN) capsule Take 1 capsule by mouth daily.      . naproxen sodium (ALEVE) 220 MG tablet Take 440 mg by mouth.    . NON FORMULARY Phytoestrogen 2 tablets daily    . omeprazole (PRILOSEC) 20 MG capsule TAKE 1 CAPSULE BY MOUTH TWICE DAILY AS DIRECTED BEFORE A MEAL 180 capsule 3  . potassium chloride SA (KLOR-CON) 20 MEQ tablet TAKE ONE-HALF TABLET BY MOUTH EVERY DAY 90 tablet 3  . sertraline (ZOLOFT) 50 MG tablet TAKE ONE TABLET BY MOUTH EVERY DAY 90 tablet 3  . triamcinolone cream (KENALOG) 0.1 % Apply 1 application topically 2 (two) times daily as needed. 45 g 1  . albuterol (PROVENTIL HFA;VENTOLIN  HFA) 108 (90 Base) MCG/ACT inhaler Inhale 2 puffs into the lungs every 6 (six) hours as needed for wheezing or shortness of breath. (Patient not taking: Reported on 04/18/2020) 1 Inhaler 0   No current facility-administered medications on file prior to visit.    Allergies  Allergen Reactions  . Codeine Sulfate     REACTION: Nausea    Past Medical History:  Diagnosis Date  . Allergy   . Breast fibroadenoma   . Diverticulitis   . GERD (gastroesophageal reflux disease)   . Hypertension   . Migraines   . Migraines   . Premenstrual dysphoric disorder     Past Surgical History:  Procedure Laterality Date  . BACK SURGERY  3/07   c6-c7 fused   . BREAST BIOPSY Left 2013   negative with clip  . BREAST BIOPSY Right 08/11/2018   BX with Dr. Bary Castilla, ribbon marker, FIBROADENOMA WITH FOCAL USUAL DUCT EPITHELIAL HYPERPLASIA  . BREAST EXCISIONAL BIOPSY Bilateral yrs ago   benign  . CERVICAL FUSION     C6-7  . DILATION AND CURETTAGE OF UTERUS  10/20/14  . TONSILLECTOMY    . Uterine ablation  5/16   Dr De Burrs  . wisdom teeth removal   1996    Family History  Problem Relation Age of Onset  . Coronary artery disease Father   . Hypertension Father   .  Colon cancer Maternal Uncle   . Colon cancer Maternal Grandmother   . Coronary artery disease Paternal Grandmother   . Colon cancer Paternal Grandfather   . Breast cancer Sister 64  . Colon polyps Mother     Social History   Socioeconomic History  . Marital status: Married    Spouse name: Not on file  . Number of children: 2  . Years of education: Not on file  . Highest education level: Not on file  Occupational History  . Occupation:  Merchandiser, retail  . Occupation:    Tobacco Use  . Smoking status: Never Smoker  . Smokeless tobacco: Never Used  Substance and Sexual Activity  . Alcohol use: No    Alcohol/week: 0.0 standard drinks  . Drug use: No  . Sexual activity: Not on file  Other Topics Concern  . Not on  file  Social History Narrative   Divorced 2012--- remarried 2015   Social Determinants of Health   Financial Resource Strain:   . Difficulty of Paying Living Expenses: Not on file  Food Insecurity:   . Worried About Charity fundraiser in the Last Year: Not on file  . Ran Out of Food in the Last Year: Not on file  Transportation Needs:   . Lack of Transportation (Medical): Not on file  . Lack of Transportation (Non-Medical): Not on file  Physical Activity:   . Days of Exercise per Week: Not on file  . Minutes of Exercise per Session: Not on file  Stress:   . Feeling of Stress : Not on file  Social Connections:   . Frequency of Communication with Friends and Family: Not on file  . Frequency of Social Gatherings with Friends and Family: Not on file  . Attends Religious Services: Not on file  . Active Member of Clubs or Organizations: Not on file  . Attends Archivist Meetings: Not on file  . Marital Status: Not on file  Intimate Partner Violence:   . Fear of Current or Ex-Partner: Not on file  . Emotionally Abused: Not on file  . Physically Abused: Not on file  . Sexually Abused: Not on file   Review of Systems No N/V Eating okay Taste is not normal Also lost sense of smell    Objective:   Physical Exam Constitutional:      Appearance: Normal appearance.  Pulmonary:     Effort: Pulmonary effort is normal.  Neurological:     Mental Status: She is alert.            Assessment & Plan:

## 2020-04-18 NOTE — Assessment & Plan Note (Signed)
Given her exposure to COVID and her loss of taste and smell, she almost certainly has COVID with symptoms starting 8/20 Mostly mild and minimal cough and no fever/ SOB Hasn't been tested but if worsens, should be considered for infusion therapy--discussed this Discussed supportive Rx---including analgesics and fluticasone nasal Consider empiric antibiotic if sinus symptoms worsening next week Okay to return to work 9/3-9/4 if symptoms improved

## 2020-04-27 ENCOUNTER — Other Ambulatory Visit: Payer: Self-pay | Admitting: Internal Medicine

## 2020-05-31 DIAGNOSIS — Z8371 Family history of colonic polyps: Secondary | ICD-10-CM | POA: Diagnosis not present

## 2020-05-31 DIAGNOSIS — K219 Gastro-esophageal reflux disease without esophagitis: Secondary | ICD-10-CM | POA: Diagnosis not present

## 2020-05-31 DIAGNOSIS — K224 Dyskinesia of esophagus: Secondary | ICD-10-CM | POA: Diagnosis not present

## 2020-06-06 NOTE — Progress Notes (Signed)
Tracy Arthurs T. Shetara Launer, MD, [place] at [facility] [place], [address]  Phone: [phone]   FAX: [phone]  [name] - 49 y.o. female   MRN [id number]   Date of Birth: February 22, 1971  Date: 06/07/2020   PCP: Venia Carbon, MD   Referral: Venia Carbon, MD  Chief Complaint  Patient presents with   Foot Swelling    Continue swelling after sprain in July    This visit occurred during the SARS-CoV-2 public health emergency.  Safety protocols were in place, including screening questions prior to the visit, additional usage of staff PPE, and extensive cleaning of exam room while observing appropriate contact time as indicated for disinfecting solutions.   Subjective:   [name] is a 49 y.o. very pleasant female patient with Body mass index is 29.44 kg/m. who presents with the following:  [name] is here for a new consultation regarding left foot pain, but she had an ankle sprain 3 months ago.  On chart review, the patient did have a fall and had a discolored area on the top of her foot.  Per report on chart review, it looks like the area is incompletely healed per her report.  She is here today to follow-up with me regarding this.  She continues to have some pain in the midfoot region.  She still does have some slight discoloration, and she predominantly had pain initially in the foot with some almost immediate swelling and ecchymosis.  Her initial x-ray did not show any acute fracture.  She has continued to have pain and pain with working on her feet.  She does work as an Engineer, production.  Review of Systems is noted in the HPI, as appropriate   Objective:   BP 120/90    Pulse 76    Temp 97.7 F (36.5 C) (Temporal)    Ht 5\' 4"  (1.626 m)    Wt 171 lb 8 oz (77.8 kg)    SpO2 96%    BMI 29.44 kg/m   Left foot and ankle: She is entirely nontender around the ankle including  the medial lateral malleolus.  The talus is nontender.  Calcaneal squeeze is nontender.  Nontender at the navicular, cuboid, cuneiforms, and the entirety of the bony midfoot.  Drawer testing is intact and does not provoke pain.  All phalanges have full motion without any form of pain in any way.  She does have some pain at the mid to proximal third and fourth metatarsal shafts with palpation, and the fifth metatarsal and first metatarsal are entirely nontender including the bases.  Radiology: DG Ankle Complete Left  Result Date: 06/09/2020 CLINICAL DATA:  Third and fourth metatarsal pain status post injury 3 months ago. EXAM: LEFT ANKLE COMPLETE - 3+ VIEW COMPARISON:  None. FINDINGS: No acute fracture or dislocation. No aggressive osseous lesion. Normal alignment. Soft tissue are unremarkable. No radiopaque foreign body or soft tissue emphysema. IMPRESSION: No acute osseous injury of the left ankle. Electronically Signed   By: Kathreen Devoid   On: 06/09/2020 15:17   DG Foot Complete Left  Result Date: 06/09/2020 CLINICAL DATA:  Pain in the third and fourth metatarsals EXAM: LEFT FOOT - COMPLETE 3+ VIEW COMPARISON:  None. FINDINGS: No acute fracture or dislocation. No aggressive osseous lesion. Normal alignment. Small plantar calcaneal spur. Soft tissue are unremarkable. No radiopaque foreign body or soft tissue emphysema.  IMPRESSION: No acute osseous injury of the left foot. Electronically Signed   By: Kathreen Devoid   On: 06/09/2020 15:17     Assessment and Plan:     ICD-10-CM   1. Acute foot pain, left  M79.672 DG Ankle Complete Left    DG Foot Complete Left   With history, I suspect that she had a very small occult fracture or a stress fracture equivalent from bone contusion.  On the plain film, there is a subtle change at the 3rd metatarsal would may indicate an area of prior injury not mentioned on radiology interpretation.   This corresponds to her area of maximal pain.  More supportive  shoe with much less bending from MT heads to feel.  No additional support needed 3 months out.  No orders of the defined types were placed in this encounter.  Medications Discontinued During This Encounter  Medication Reason   losartan-hydrochlorothiazide (HYZAAR) 599-35 MG tablet Duplicate   Orders Placed This Encounter  Procedures   DG Ankle Complete Left   DG Foot Complete Left    Follow-up: No follow-ups on file.  Signed,  Tracy Deed. Madeline Pho, MD   Outpatient Encounter Medications as of 06/07/2020  Medication Sig   albuterol (PROVENTIL HFA;VENTOLIN HFA) 108 (90 Base) MCG/ACT inhaler Inhale 2 puffs into the lungs every 6 (six) hours as needed for wheezing or shortness of breath.   aspirin (ASPIRIN 81) 81 MG EC tablet    butalbital-acetaminophen-caffeine (FIORICET) 50-325-40 MG tablet TAKE ONE TABLET BY MOUTH EVERY 4 HOURS AS NEEDED FOR HEADACHE   cetirizine (ZYRTEC) 10 MG tablet Take 10 mg by mouth daily as needed.     Glycerin-Polysorbate 80 (REFRESH DRY EYE THERAPY OP) Apply to eye.   hydrochlorothiazide (HYDRODIURIL) 25 MG tablet TAKE ONE TABLET BY MOUTH EVERY DAY   losartan (COZAAR) 100 MG tablet TAKE ONE TABLET BY MOUTH EVERY DAY   melatonin 5 MG TABS    Multiple Vitamin (MULTIVITAMIN) capsule Take 1 capsule by mouth daily.     naproxen sodium (ALEVE) 220 MG tablet Take 440 mg by mouth.   NON FORMULARY Phytoestrogen 2 tablets daily   omeprazole (PRILOSEC) 20 MG capsule TAKE 1 CAPSULE BY MOUTH TWICE DAILY AS DIRECTED BEFORE A MEAL   potassium chloride SA (KLOR-CON) 20 MEQ tablet TAKE ONE-HALF TABLET BY MOUTH EVERY DAY   sertraline (ZOLOFT) 50 MG tablet TAKE ONE TABLET BY MOUTH EVERY DAY   triamcinolone cream (KENALOG) 0.1 % Apply 1 application topically 2 (two) times daily as needed.   [DISCONTINUED] losartan-hydrochlorothiazide (HYZAAR) 100-25 MG tablet TAKE 1 TABLET DAILY   No facility-administered encounter medications on file as of 06/07/2020.

## 2020-06-07 ENCOUNTER — Other Ambulatory Visit: Payer: Self-pay

## 2020-06-07 ENCOUNTER — Ambulatory Visit (INDEPENDENT_AMBULATORY_CARE_PROVIDER_SITE_OTHER)
Admission: RE | Admit: 2020-06-07 | Discharge: 2020-06-07 | Disposition: A | Discharge status: Home / Self Care | Payer: 59 | Source: Ambulatory Visit | Attending: Family Medicine | Admitting: Family Medicine

## 2020-06-07 ENCOUNTER — Ambulatory Visit: Payer: 59 | Admitting: Family Medicine

## 2020-06-07 ENCOUNTER — Encounter: Payer: Self-pay | Admitting: Family Medicine

## 2020-06-07 VITALS — BP 120/90 | HR 76 | Temp 97.7°F | Ht 64.0 in | Wt 171.5 lb

## 2020-06-07 DIAGNOSIS — M79672 Pain in left foot: Secondary | ICD-10-CM | POA: Diagnosis not present

## 2020-06-07 DIAGNOSIS — M25572 Pain in left ankle and joints of left foot: Secondary | ICD-10-CM | POA: Diagnosis not present

## 2020-06-07 DIAGNOSIS — M7732 Calcaneal spur, left foot: Secondary | ICD-10-CM | POA: Diagnosis not present

## 2020-06-23 DIAGNOSIS — L538 Other specified erythematous conditions: Secondary | ICD-10-CM | POA: Diagnosis not present

## 2020-06-23 DIAGNOSIS — L980 Pyogenic granuloma: Secondary | ICD-10-CM | POA: Diagnosis not present

## 2020-06-23 DIAGNOSIS — D485 Neoplasm of uncertain behavior of skin: Secondary | ICD-10-CM | POA: Diagnosis not present

## 2020-06-23 DIAGNOSIS — R58 Hemorrhage, not elsewhere classified: Secondary | ICD-10-CM | POA: Diagnosis not present

## 2020-07-11 DIAGNOSIS — Z01818 Encounter for other preprocedural examination: Secondary | ICD-10-CM | POA: Diagnosis not present

## 2020-07-11 DIAGNOSIS — R58 Hemorrhage, not elsewhere classified: Secondary | ICD-10-CM | POA: Diagnosis not present

## 2020-07-11 DIAGNOSIS — L980 Pyogenic granuloma: Secondary | ICD-10-CM | POA: Diagnosis not present

## 2020-07-11 DIAGNOSIS — R208 Other disturbances of skin sensation: Secondary | ICD-10-CM | POA: Diagnosis not present

## 2020-07-14 DIAGNOSIS — Z8371 Family history of colonic polyps: Secondary | ICD-10-CM | POA: Diagnosis not present

## 2020-07-14 DIAGNOSIS — Z1211 Encounter for screening for malignant neoplasm of colon: Secondary | ICD-10-CM | POA: Diagnosis not present

## 2020-07-14 DIAGNOSIS — K5730 Diverticulosis of large intestine without perforation or abscess without bleeding: Secondary | ICD-10-CM | POA: Diagnosis not present

## 2020-07-14 DIAGNOSIS — K640 First degree hemorrhoids: Secondary | ICD-10-CM | POA: Diagnosis not present

## 2020-07-14 LAB — HM COLONOSCOPY

## 2020-07-17 ENCOUNTER — Other Ambulatory Visit: Payer: Self-pay | Admitting: Internal Medicine

## 2020-10-09 ENCOUNTER — Other Ambulatory Visit: Payer: Self-pay

## 2020-10-09 DIAGNOSIS — Z1231 Encounter for screening mammogram for malignant neoplasm of breast: Secondary | ICD-10-CM

## 2020-11-08 ENCOUNTER — Ambulatory Visit
Admission: RE | Admit: 2020-11-08 | Discharge: 2020-11-08 | Disposition: A | Discharge status: Home / Self Care | Payer: 59 | Source: Ambulatory Visit | Attending: Surgery | Admitting: Surgery

## 2020-11-08 ENCOUNTER — Other Ambulatory Visit: Payer: Self-pay

## 2020-11-08 DIAGNOSIS — Z1231 Encounter for screening mammogram for malignant neoplasm of breast: Secondary | ICD-10-CM | POA: Diagnosis not present

## 2020-11-22 ENCOUNTER — Ambulatory Visit: Payer: 59 | Admitting: Surgery

## 2020-12-06 ENCOUNTER — Encounter: Payer: Self-pay | Admitting: Surgery

## 2020-12-06 ENCOUNTER — Ambulatory Visit: Payer: Managed Care, Other (non HMO) | Admitting: Surgery

## 2020-12-06 ENCOUNTER — Other Ambulatory Visit: Payer: Self-pay

## 2020-12-06 VITALS — BP 115/74 | HR 97 | Temp 98.8°F | Ht 64.0 in | Wt 170.6 lb

## 2020-12-06 DIAGNOSIS — Z1231 Encounter for screening mammogram for malignant neoplasm of breast: Secondary | ICD-10-CM

## 2020-12-06 NOTE — Patient Instructions (Signed)
We will repeat your mammo in 6 months with a follow up. If you have any concerns or questions, please feel free to call our office.

## 2020-12-06 NOTE — Progress Notes (Signed)
Outpatient Surgical Follow Up  12/06/2020  [name] is an 50 y.o. female.   Chief Complaint  Patient presents with  . 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  She now comes for yearly physical exam.  I have personally reviewed her mammogram showing no evidence of any suspicious lesions.  She denies any breast lesions.  No fevers no chills no breast discharge.  No weight loss. 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 Jamal Collin in 2013. She Does have significant history of colorectal cancer in her family.  She does have an upcoming colonoscopy. SHe does have 2 sisters with diagnosis of breast cancer.  Past Medical History:  Diagnosis Date  . Allergy   . Breast fibroadenoma   . Diverticulitis   . GERD (gastroesophageal reflux disease)   . Hypertension   . Migraines   . Migraines   . Premenstrual dysphoric disorder     Past Surgical History:  Procedure Laterality Date  . BACK SURGERY  3/07   c6-c7 fused   . BREAST BIOPSY Left 2013   negative with clip  . BREAST BIOPSY Right 08/11/2018   BX with Dr. Bary Castilla, ribbon marker, FIBROADENOMA WITH FOCAL USUAL DUCT EPITHELIAL HYPERPLASIA  . BREAST EXCISIONAL BIOPSY Bilateral yrs ago   benign  . CERVICAL FUSION     C6-7  . COLONOSCOPY    . DILATION AND CURETTAGE OF UTERUS  10/20/14  . TONSILLECTOMY    . Uterine ablation  5/16   Dr De Burrs  . wisdom teeth removal   1996    Family History  Problem Relation Age of Onset  . Coronary artery disease Father   . Hypertension Father   . Colon cancer Maternal Uncle   . Colon cancer Maternal Grandmother   . Coronary artery disease Paternal Grandmother   . Colon cancer Paternal Grandfather   . Breast cancer Sister 96  . Colon polyps Mother     Social History:  reports that she has never smoked. She has never used  smokeless tobacco. She reports that she does not drink alcohol and does not use drugs.  Allergies:  Allergies  Allergen Reactions  . Codeine Sulfate     REACTION: Nausea    Medications reviewed.    ROS Full ROS performed and is otherwise negative other than what is stated in HPI   BP 115/74   Pulse 97   Temp 98.8 F (37.1 C) (Oral)   Ht 5\' 4"  (1.626 m)   Wt 170 lb 9.6 oz (77.4 kg)   SpO2 94%   BMI 29.28 kg/m   Physical Exam Vitals and nursing note reviewed. Exam conducted with a chaperone present.  Constitutional:      General: She is not in acute distress.    Appearance: Normal appearance. She is normal weight.  Cardiovascular:     Rate and Rhythm: Normal rate and regular rhythm.     Heart sounds: No murmur heard. No friction rub.  Pulmonary:     Effort: Pulmonary effort is normal. No respiratory distress.     Breath sounds: Normal breath sounds. No stridor. No wheezing.     Comments: BREAST: No evidence of palpable lesions, prior lumpectomy scars bilaterally. No LAD. Abdominal:     General: Abdomen is flat. There is no distension.  Palpations: Abdomen is soft. There is no mass.     Tenderness: There is no abdominal tenderness. There is no guarding or rebound.     Hernia: No hernia is present.  Musculoskeletal:        General: No swelling or tenderness. Normal range of motion.     Cervical back: Normal range of motion and neck supple. No rigidity or tenderness.  Lymphadenopathy:     Cervical: No cervical adenopathy.  Skin:    General: Skin is warm and dry.     Capillary Refill: Capillary refill takes less than 2 seconds.  Neurological:     General: No focal deficit present.     Mental Status: She is alert and oriented to person, place, and time.  Psychiatric:        Mood and Affect: Mood normal.        Behavior: Behavior normal.        Thought Content: Thought content normal.        Judgment: Judgment normal.      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  There is no evidence of any new concerning lesions on physical exam nor imaging studies.  We will continue yearly exams with mammograms.  No need for any surgical intervention at this time.    Greater than 50% of the 30 minutes  visit was spent in counseling/coordination of care   Caroleen Hamman, MD [facility]

## 2020-12-27 MED ORDER — ALBUTEROL SULFATE HFA 108 (90 BASE) MCG/ACT IN AERS: 2.0000 | INHALATION_SPRAY | Freq: Four times a day (QID) | RESPIRATORY_TRACT | 1 refills | Status: DC | PRN

## 2020-12-27 MED ORDER — IPRATROPIUM-ALBUTEROL 0.5-2.5 (3) MG/3ML IN SOLN: 3.0000 mL | Freq: Four times a day (QID) | RESPIRATORY_TRACT | 1 refills | Status: DC | PRN

## 2021-01-24 ENCOUNTER — Encounter: Payer: Self-pay | Admitting: Internal Medicine

## 2021-01-24 ENCOUNTER — Other Ambulatory Visit: Payer: Self-pay

## 2021-01-24 ENCOUNTER — Ambulatory Visit (INDEPENDENT_AMBULATORY_CARE_PROVIDER_SITE_OTHER): Payer: Managed Care, Other (non HMO) | Admitting: Internal Medicine

## 2021-01-24 DIAGNOSIS — M7711 Lateral epicondylitis, right elbow: Secondary | ICD-10-CM | POA: Insufficient documentation

## 2021-01-24 NOTE — Progress Notes (Signed)
Subjective:    Patient ID: [name], female    DOB: 1970-11-11, 50 y.o.   MRN: [id number]  HPI Here due to right elbow pain This visit occurred during the SARS-CoV-2 public health emergency.  Safety protocols were in place, including screening questions prior to the visit, additional usage of staff PPE, and extensive cleaning of exam room while observing appropriate contact time as indicated for disinfecting solutions.   Started with pain about 1 month ago achiness that is fairly constant--but sharp pain with use No known injury--no new tasks No swelling or bruising Very slight feeling at rest--then gets bad  Tried voltaren cream---twice a day for a couple of weeks. Didn't help aspercreme with lidocaine--no help Aleve 2 every morning for back, etc---unclear if it helps the elbow  Current Outpatient Medications on File Prior to Visit  Medication Sig Dispense Refill  . albuterol (VENTOLIN HFA) 108 (90 Base) MCG/ACT inhaler Inhale 2 puffs into the lungs every 6 (six) hours as needed for wheezing or shortness of breath. 18 g 1  . aspirin 81 MG EC tablet     . butalbital-acetaminophen-caffeine (FIORICET) 50-325-40 MG tablet TAKE ONE TABLET BY MOUTH EVERY 4 HOURS AS NEEDED FOR HEADACHE 30 tablet 0  . Glycerin-Polysorbate 80 (REFRESH DRY EYE THERAPY OP) Apply to eye.    . hydrochlorothiazide (HYDRODIURIL) 25 MG tablet TAKE ONE TABLET BY MOUTH EVERY DAY 90 tablet 3  . ipratropium-albuterol (DUONEB) 0.5-2.5 (3) MG/3ML SOLN Take 3 mLs by nebulization every 6 (six) hours as needed. 120 mL 1  . loratadine (CLARITIN) 10 MG tablet Take 10 mg by mouth daily.    Marland Kitchen losartan (COZAAR) 100 MG tablet TAKE ONE TABLET BY MOUTH EVERY DAY 90 tablet 3  . Melatonin 10 MG TABS Take 1 tablet by mouth at bedtime.    . Multiple Vitamin (MULTIVITAMIN) capsule Take 1 capsule by mouth daily.    . naproxen sodium (ALEVE) 220 MG tablet Take 440 mg by mouth.    . NON FORMULARY Phytoestrogen 2 tablets daily    .  omeprazole (PRILOSEC) 20 MG capsule TAKE 1 CAPSULE BY MOUTH TWICE DAILY AS DIRECTED BEFORE A MEAL 180 capsule 3  . potassium chloride SA (KLOR-CON) 20 MEQ tablet TAKE ONE-HALF TABLET BY MOUTH EVERY DAY 90 tablet 3  . sertraline (ZOLOFT) 50 MG tablet TAKE ONE TABLET BY MOUTH EVERY DAY 90 tablet 3  . triamcinolone cream (KENALOG) 0.1 % Apply 1 application topically 2 (two) times daily as needed. 45 g 1   No current facility-administered medications on file prior to visit.    Allergies  Allergen Reactions  . Codeine Sulfate     REACTION: Nausea    Past Medical History:  Diagnosis Date  . Allergy   . Breast fibroadenoma   . Diverticulitis   . GERD (gastroesophageal reflux disease)   . Hypertension   . Migraines   . Migraines   . Premenstrual dysphoric disorder     Past Surgical History:  Procedure Laterality Date  . BACK SURGERY  3/07   c6-c7 fused   . BREAST BIOPSY Left 2013   negative with clip  . BREAST BIOPSY Right 08/11/2018   BX with Dr. Bary Castilla, ribbon marker, FIBROADENOMA WITH FOCAL USUAL DUCT EPITHELIAL HYPERPLASIA  . BREAST EXCISIONAL BIOPSY Bilateral yrs ago   benign  . CERVICAL FUSION     C6-7  . COLONOSCOPY    . DILATION AND CURETTAGE OF UTERUS  10/20/14  . TONSILLECTOMY    .  Uterine ablation  5/16   Dr De Burrs  . wisdom teeth removal   1996    Family History  Problem Relation Age of Onset  . Coronary artery disease Father   . Hypertension Father   . Colon cancer Maternal Uncle   . Colon cancer Maternal Grandmother   . Coronary artery disease Paternal Grandmother   . Colon cancer Paternal Grandfather   . Breast cancer Sister 84  . Colon polyps Mother     Social History   Socioeconomic History  . Marital status: Married    Spouse name: Not on file  . Number of children: 2  . Years of education: Not on file  . Highest education level: Not on file  Occupational History  . Occupation:  Merchandiser, retail  . Occupation:    Tobacco Use  .  Smoking status: Never Smoker  . Smokeless tobacco: Never Used  Substance and Sexual Activity  . Alcohol use: No    Alcohol/week: 0.0 standard drinks  . Drug use: No  . Sexual activity: Not on file  Other Topics Concern  . 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  Musculoskeletal:     Comments: Right elbow--no swelling or bursa tenderness No sig tenderness but pain spot is lateral epicondyle Normal flexion/extension/supination/internal rotation  Neurological:     Mental Status: She is alert.            Assessment & Plan:

## 2021-01-24 NOTE — Patient Instructions (Signed)
Tennis Elbow  Tennis elbow (lateral epicondylitis) is inflammation of tendons in your outer forearm, near your elbow. Tendons are tissues that connect muscle to bone. When you have tennis elbow, inflammation affects the tendons that you use to bend your wrist and move your hand up. Inflammation occurs in the lower part of the upper arm bone (humerus), where the tendons connect to the bone (lateral epicondyle). Tennis elbow often affects people who play tennis, but anyone may get the condition from repeatedly extending the wrist or turning the forearm. What are the causes? This condition is usually caused by repeatedly extending the wrist, turning the forearm, and using the hands. It can result from sports or work that requires repetitive forearm movements. In some cases, it may be caused by a sudden injury. What increases the risk? You are more likely to develop tennis elbow if you play tennis or another racket sport. You also have a higher risk if you frequently use your hands for work. Besides people who play tennis, others at greater risk include:  People who use computers.  Construction workers.  People who work in factories.  Musicians.  Cooks.  Cashiers. What are the signs or symptoms? Symptoms of this condition include:  Pain and tenderness in the forearm and the outer part of the elbow. Pain may be felt only when using the arm, or it may be there all the time.  A burning feeling that starts in the elbow and spreads down the forearm.  A weak grip in the hand. How is this diagnosed? This condition is diagnosed based on your symptoms, your medical history, and a physical exam. You may also have X-rays or an MRI to:  Confirm the diagnosis.  Look for other issues.  Check for tears in the ligaments, muscles, or tendons. How is this treated? Resting and icing your arm is often the first treatment. Your health care provider may also recommend:  Medicines to reduce pain and  inflammation. These may be in the form of a pill, topical gels, or shots of a steroid medicine (cortisone).  An elbow strap to reduce stress on the area.  Physical therapy. This may include massage or exercises or both.  An elbow brace to restrict the movements that cause symptoms. If these treatments do not help relieve your symptoms, your health care provider may recommend surgery to remove damaged muscle and reattach healthy muscle to bone. Follow these instructions at home: If you have a brace or strap:  Wear the brace or strap as told by your health care provider. Remove it only as told by your health care provider.  Check the skin around the brace or strap every day. Tell your health care provider about any concerns.  Loosen the brace if your fingers tingle, become numb, or turn cold and blue.  Keep the brace clean.  If the brace or strap is not waterproof: ? Do not let it get wet. ? Cover it with a watertight covering when you take a bath or a shower. Managing pain, stiffness, and swelling  If directed, put ice on the injured area. To do this: ? If you have a removable brace or strap, remove it as told by your health care provider. ? Put ice in a plastic bag. ? Place a towel between your skin and the bag. ? Leave the ice on for 20 minutes, 2-3 times a day. ? Remove the ice if your skin turns bright red. This is very important. If you cannot   feel pain, heat, or cold, you have a greater risk of damage to the area.  Move your fingers often to reduce stiffness and swelling.   Activity  Rest your elbow and wrist and avoid activities that cause symptoms as told by your health care provider.  Do physical therapy exercises as told by your health care provider.  If you lift an object, lift it with your palm facing up. This reduces stress on your elbow. Lifestyle  If your tennis elbow is caused by sports, check your equipment and make sure that: ? You use it correctly. ? It is  good match for you.  If your tennis elbow is caused by work or computer use, take frequent breaks to stretch your arm. Talk with your employer about ways to manage your condition at work. General instructions  Take over-the-counter and prescription medicines only as told by your health care provider.  Do not use any products that contain nicotine or tobacco. These products include cigarettes, chewing tobacco, and vaping devices, such as e-cigarettes. If you need help quitting, ask your health care provider.  Keep all follow-up visits. This is important. How is this prevented?  Before and after activity: ? Warm up and stretch before being active. ? Cool down and stretch after being active. ? Give your body time to rest between periods of activity.  During activity: ? Make sure to use equipment that fits you. ? If you play tennis, put power in your stroke with your lower body. Avoid using your arm only.  Maintain physical fitness, including: ? Strength. ? Flexibility. ? Endurance.  Do exercises to strengthen the forearm muscles. Contact a health care provider if:  You have pain that gets worse or does not get better with treatment.  You have numbness or weakness in your forearm, hand, or fingers. Get help right away if:  Your pain is severe.  You cannot move your wrist. Summary  Tennis elbow (lateral epicondylitis) is inflammation of tendons in your outer forearm, near your elbow.  Common symptoms include pain and tenderness in your forearm and the outer part of your elbow.  This condition is usually caused by repeatedly extending your wrist, turning your forearm, and using your hands.  The first treatment is often resting and icing your arm to relieve symptoms. Further treatment may include taking medicine, getting physical therapy, wearing a brace or strap, or having surgery. This information is not intended to replace advice given to you by your health care provider.  Make sure you discuss any questions you have with your health care provider. Document Revised: 02/22/2020 Document Reviewed: 02/22/2020 Elsevier Patient Education  2021 [employer]  

## 2021-01-24 NOTE — Assessment & Plan Note (Signed)
No clear inciting factors Discussed tendon strap, ice, plain lidocaine Consider the diclofenac again topically Next step is sports medicine

## 2021-02-28 ENCOUNTER — Ambulatory Visit (INDEPENDENT_AMBULATORY_CARE_PROVIDER_SITE_OTHER): Payer: Managed Care, Other (non HMO) | Admitting: Internal Medicine

## 2021-02-28 ENCOUNTER — Other Ambulatory Visit: Payer: Self-pay

## 2021-02-28 ENCOUNTER — Other Ambulatory Visit (HOSPITAL_COMMUNITY)
Admission: RE | Admit: 2021-02-28 | Discharge: 2021-02-28 | Disposition: A | Discharge status: Home / Self Care | Payer: Managed Care, Other (non HMO) | Source: Ambulatory Visit | Attending: Internal Medicine | Admitting: Internal Medicine

## 2021-02-28 ENCOUNTER — Encounter: Payer: Self-pay | Admitting: Internal Medicine

## 2021-02-28 VITALS — BP 140/88 | HR 72 | Temp 98.0°F | Ht 64.25 in | Wt 168.0 lb

## 2021-02-28 DIAGNOSIS — Z0000 Encounter for general adult medical examination without abnormal findings: Secondary | ICD-10-CM

## 2021-02-28 DIAGNOSIS — F39 Unspecified mood [affective] disorder: Secondary | ICD-10-CM

## 2021-02-28 DIAGNOSIS — I10 Essential (primary) hypertension: Secondary | ICD-10-CM | POA: Diagnosis not present

## 2021-02-28 DIAGNOSIS — N951 Menopausal and female climacteric states: Secondary | ICD-10-CM | POA: Diagnosis not present

## 2021-02-28 DIAGNOSIS — Z23 Encounter for immunization: Secondary | ICD-10-CM | POA: Diagnosis not present

## 2021-02-28 LAB — LIPID PANEL
Cholesterol: 208 mg/dL — ABNORMAL HIGH (ref 0–200)
HDL: 62.2 mg/dL (ref >39.00)
LDL Cholesterol: 127 mg/dL — ABNORMAL HIGH (ref 0–99)
NonHDL: 146.06
Total CHOL/HDL Ratio: 3
Triglycerides: 94 mg/dL (ref 0.0–149.0)
VLDL: 18.8 mg/dL (ref 0.0–40.0)

## 2021-02-28 LAB — COMPREHENSIVE METABOLIC PANEL
ALT: 20 U/L (ref 0–35)
AST: 17 U/L (ref 0–37)
Albumin: 4.1 g/dL (ref 3.5–5.2)
Alkaline Phosphatase: 97 U/L (ref 39–117)
BUN: 17 mg/dL (ref 6–23)
CO2: 33 mEq/L — ABNORMAL HIGH (ref 19–32)
Calcium: 9 mg/dL (ref 8.4–10.5)
Chloride: 102 mEq/L (ref 96–112)
Creatinine, Ser: 0.78 mg/dL (ref 0.40–1.20)
GFR: 87.65 mL/min (ref >60.00)
Glucose, Bld: 84 mg/dL (ref 70–99)
Potassium: 4.3 mEq/L (ref 3.5–5.1)
Sodium: 140 mEq/L (ref 135–145)
Total Bilirubin: 0.4 mg/dL (ref 0.2–1.2)
Total Protein: 7.2 g/dL (ref 6.0–8.3)

## 2021-02-28 LAB — CBC
HCT: 39.5 % (ref 36.0–46.0)
Hemoglobin: 13.4 g/dL (ref 12.0–15.0)
MCHC: 34 g/dL (ref 30.0–36.0)
MCV: 82.7 fl (ref 78.0–100.0)
Platelets: 316 10*3/uL (ref 150.0–400.0)
RBC: 4.77 Mil/uL (ref 3.87–5.11)
RDW: 14.5 % (ref 11.5–15.5)
WBC: 6.6 10*3/uL (ref 4.0–10.5)

## 2021-02-28 MED ORDER — GABAPENTIN 300 MG PO CAPS: 300.0000 mg | ORAL_CAPSULE | Freq: Every day | ORAL | 3 refills | Status: DC

## 2021-02-28 NOTE — Assessment & Plan Note (Signed)
Doing okay with low dose sertraline

## 2021-02-28 NOTE — Assessment & Plan Note (Signed)
Healthy Normal colon 11/21--due again 2031 Mammogram yearly--had already in March Pap today Consider COVID second booster shingrix today Flu vaccine in the fall

## 2021-02-28 NOTE — Assessment & Plan Note (Signed)
BP Readings from Last 3 Encounters:  02/28/21 140/88  01/24/21 122/86  12/06/20 115/74   Good control on losartan

## 2021-02-28 NOTE — Progress Notes (Signed)
Subjective:    Patient ID: [name], female    DOB: 1971/08/08, 50 y.o.   MRN: [id number]  HPI Here for physical This visit occurred during the SARS-CoV-2 public health emergency.  Safety protocols were in place, including screening questions prior to the visit, additional usage of staff PPE, and extensive cleaning of exam room while observing appropriate contact time as indicated for disinfecting solutions.   Having bad hot flashes--only 1 night of good sleep a week Interested in Rx---no major issues in daytime  Saw ortho about the arm Did get injection--now 75% better (but it was very painful)  Had basal cell carcinoma removed from left arm Will keep up regularly with derm  Current Outpatient Medications on File Prior to Visit  Medication Sig Dispense Refill   albuterol (VENTOLIN HFA) 108 (90 Base) MCG/ACT inhaler Inhale 2 puffs into the lungs every 6 (six) hours as needed for wheezing or shortness of breath. 18 g 1   aspirin 81 MG EC tablet      butalbital-acetaminophen-caffeine (FIORICET) 50-325-40 MG tablet TAKE ONE TABLET BY MOUTH EVERY 4 HOURS AS NEEDED FOR HEADACHE 30 tablet 0   Glycerin-Polysorbate 80 (REFRESH DRY EYE THERAPY OP) Apply to eye.     hydrochlorothiazide (HYDRODIURIL) 25 MG tablet TAKE ONE TABLET BY MOUTH EVERY DAY 90 tablet 3   ipratropium-albuterol (DUONEB) 0.5-2.5 (3) MG/3ML SOLN Take 3 mLs by nebulization every 6 (six) hours as needed. 120 mL 1   loratadine (CLARITIN) 10 MG tablet Take 10 mg by mouth daily.     losartan (COZAAR) 100 MG tablet TAKE ONE TABLET BY MOUTH EVERY DAY 90 tablet 3   Melatonin 10 MG TABS Take 1 tablet by mouth at bedtime.     Multiple Vitamin (MULTIVITAMIN) capsule Take 1 capsule by mouth daily.     naproxen sodium (ALEVE) 220 MG tablet Take 440 mg by mouth.     NON FORMULARY Phytoestrogen 2 tablets daily     omeprazole (PRILOSEC) 20 MG capsule TAKE 1 CAPSULE BY MOUTH TWICE DAILY AS DIRECTED BEFORE A MEAL 180 capsule 3    potassium chloride SA (KLOR-CON) 20 MEQ tablet TAKE ONE-HALF TABLET BY MOUTH EVERY DAY 90 tablet 3   sertraline (ZOLOFT) 50 MG tablet TAKE ONE TABLET BY MOUTH EVERY DAY 90 tablet 3   No current facility-administered medications on file prior to visit.    Allergies  Allergen Reactions   Codeine Sulfate     REACTION: Nausea    Past Medical History:  Diagnosis Date   Allergy    Breast fibroadenoma    Diverticulitis    GERD (gastroesophageal reflux disease)    Hypertension    Migraines    Migraines    Premenstrual dysphoric disorder     Past Surgical History:  Procedure Laterality Date   BACK SURGERY  3/07   c6-c7 fused    BREAST BIOPSY Left 2013   negative with clip   BREAST BIOPSY Right 08/11/2018   BX with Dr. Bary Castilla, ribbon marker, FIBROADENOMA WITH FOCAL USUAL DUCT EPITHELIAL HYPERPLASIA   BREAST EXCISIONAL BIOPSY Bilateral yrs ago   benign   CERVICAL FUSION     C6-7   COLONOSCOPY     DILATION AND CURETTAGE OF UTERUS  10/20/14   TONSILLECTOMY     Uterine ablation  5/16   Dr De Burrs   wisdom teeth removal   1996    Family History  Problem Relation Age of Onset   Colon polyps Mother  Atrial fibrillation Mother    Congestive Heart Failure Mother    Coronary artery disease Father    Hypertension Father    Lung cancer Father    Breast cancer Sister 57   Colon cancer Maternal Grandmother    Coronary artery disease Paternal Grandmother    Colon cancer Paternal Grandfather    Colon cancer Maternal Uncle     Social History   Socioeconomic History   Marital status: Married    Spouse name: Not on file   Number of children: 2   Years of education: Not on file   Highest education level: Not on file  Occupational History   Occupation:  Hospice nurse   Occupation:    Tobacco Use   Smoking status: Never   Smokeless tobacco: Never  Substance and Sexual Activity   Alcohol use: No    Alcohol/week: 0.0 standard drinks   Drug use: No   Sexual  activity: Not on file  Other Topics Concern   Not on file  Social History Narrative   Divorced 2012--- remarried 2015   Social Determinants of Health   Financial Resource Strain: Not on file  Food Insecurity: Not on file  Transportation Needs: Not on file  Physical Activity: Not on file  Stress: Not on file  Social Connections: Not on file  Intimate Partner Violence: Not on file   Review of Systems  Constitutional:  Negative for fatigue and unexpected weight change.       Exercises regularly  Wears seat belt  HENT:  Negative for dental problem, hearing loss, tinnitus and trouble swallowing.        Keeps up with dentist  Eyes:  Negative for visual disturbance.       No diplopia or unilateral vision loss  Respiratory:  Negative for cough, chest tightness and shortness of breath.   Cardiovascular:  Negative for palpitations and leg swelling.  Gastrointestinal:  Negative for blood in stool and constipation.       Some chest pain/heartburn---will use tums prn in addition to daily prilosec  Endocrine: Negative for polydipsia and polyuria.  Genitourinary:  Negative for difficulty urinating, dyspareunia, dysuria and hematuria.  Musculoskeletal:  Positive for back pain. Negative for arthralgias and joint swelling.  Skin:  Negative for rash.  Allergic/Immunologic: Negative for immunocompromised state.       Loratadine helps  Neurological:  Negative for dizziness, syncope and light-headedness.       Less migraines lately  Hematological:  Negative for adenopathy. Does not bruise/bleed easily.  Psychiatric/Behavioral:  Positive for sleep disturbance. Negative for dysphoric mood. The patient is not nervous/anxious.       Objective:   Physical Exam Constitutional:      Appearance: Normal appearance.  HENT:     Right Ear: Tympanic membrane and ear canal normal.     Left Ear: Tympanic membrane and ear canal normal.     Mouth/Throat:     Pharynx: No oropharyngeal exudate or posterior  oropharyngeal erythema.  Eyes:     Conjunctiva/sclera: Conjunctivae normal.     Pupils: Pupils are equal, round, and reactive to light.  Cardiovascular:     Rate and Rhythm: Normal rate and regular rhythm.     Pulses: Normal pulses.     Heart sounds: No murmur heard.   No gallop.  Pulmonary:     Effort: Pulmonary effort is normal.     Breath sounds: Normal breath sounds. No wheezing or rales.  Abdominal:     Palpations: Abdomen  is soft.     Tenderness: There is no abdominal tenderness.  Genitourinary:    Comments: Normal introitus Cervix appears normal (has metaplasia)---pap done Musculoskeletal:     Cervical back: Neck supple.     Right lower leg: No edema.     Left lower leg: No edema.  Lymphadenopathy:     Cervical: No cervical adenopathy.  Skin:    General: Skin is warm.     Findings: No rash.  Neurological:     General: No focal deficit present.     Mental Status: She is alert and oriented to person, place, and time.  Psychiatric:        Mood and Affect: Mood normal.        Behavior: Behavior normal.           Assessment & Plan:

## 2021-02-28 NOTE — Addendum Note (Signed)
Addended by: Pilar Grammes on: 02/28/2021 10:45 AM   Modules accepted: Orders

## 2021-02-28 NOTE — Assessment & Plan Note (Signed)
Keeping her awake Will try bedtime gabapentin Consider adding venlafaxine

## 2021-03-01 LAB — CYTOLOGY - PAP
Comment: NEGATIVE
Diagnosis: NEGATIVE
High risk HPV: NEGATIVE

## 2021-03-16 ENCOUNTER — Other Ambulatory Visit: Payer: Self-pay | Admitting: Internal Medicine

## 2021-03-22 ENCOUNTER — Other Ambulatory Visit: Payer: Self-pay | Admitting: Internal Medicine

## 2021-04-24 ENCOUNTER — Other Ambulatory Visit: Payer: Self-pay | Admitting: Internal Medicine

## 2021-04-25 NOTE — Telephone Encounter (Signed)
Last OV 02/28/21 Last refill 03/27/20 #90/3 Next OV 03/05/22

## 2021-06-20 ENCOUNTER — Ambulatory Visit (INDEPENDENT_AMBULATORY_CARE_PROVIDER_SITE_OTHER): Payer: Managed Care, Other (non HMO)

## 2021-06-20 ENCOUNTER — Other Ambulatory Visit: Payer: Self-pay

## 2021-06-20 ENCOUNTER — Ambulatory Visit: Payer: Managed Care, Other (non HMO)

## 2021-06-20 DIAGNOSIS — Z23 Encounter for immunization: Secondary | ICD-10-CM

## 2021-06-20 NOTE — Progress Notes (Signed)
Per orders of Dr. Glori Bickers, in Dr. Alla German absence, 2nd injection of shingrix given by Loreen Freud. Patient tolerated injection well.

## 2021-07-09 ENCOUNTER — Other Ambulatory Visit: Payer: Self-pay | Admitting: Internal Medicine

## 2021-08-23 ENCOUNTER — Encounter: Payer: Self-pay | Admitting: Internal Medicine

## 2021-09-03 ENCOUNTER — Encounter: Payer: Self-pay | Admitting: Internal Medicine

## 2021-09-05 MED ORDER — VENLAFAXINE HCL ER 150 MG PO CP24: 150.0000 mg | ORAL_CAPSULE | Freq: Every day | ORAL | 5 refills | Status: DC

## 2021-10-04 ENCOUNTER — Encounter: Payer: Self-pay | Admitting: Internal Medicine

## 2021-11-02 ENCOUNTER — Encounter: Payer: Self-pay | Admitting: Internal Medicine

## 2021-11-05 ENCOUNTER — Other Ambulatory Visit: Payer: Self-pay

## 2021-11-05 DIAGNOSIS — Z1231 Encounter for screening mammogram for malignant neoplasm of breast: Secondary | ICD-10-CM

## 2021-12-19 ENCOUNTER — Ambulatory Visit
Admission: RE | Admit: 2021-12-19 | Discharge: 2021-12-19 | Disposition: A | Discharge status: Home / Self Care | Payer: BC Managed Care – PPO | Source: Ambulatory Visit | Attending: Surgery | Admitting: Surgery

## 2021-12-19 DIAGNOSIS — Z1231 Encounter for screening mammogram for malignant neoplasm of breast: Secondary | ICD-10-CM | POA: Diagnosis not present

## 2021-12-20 ENCOUNTER — Telehealth: Payer: Self-pay

## 2021-12-20 NOTE — Telephone Encounter (Signed)
Patient calls back, she is informed normal mammogram and to keep follow up as scheduled.   ?

## 2021-12-20 NOTE — Telephone Encounter (Signed)
Left message for patient to return call- per Dr.Pabon -normal mammogram keep scheduled appointment next week.  ?

## 2021-12-24 ENCOUNTER — Ambulatory Visit: Payer: Managed Care, Other (non HMO) | Admitting: Surgery

## 2021-12-26 ENCOUNTER — Ambulatory Visit: Payer: BC Managed Care – PPO | Admitting: Surgery

## 2021-12-26 ENCOUNTER — Encounter: Payer: Self-pay | Admitting: Surgery

## 2021-12-26 VITALS — BP 127/74 | HR 109 | Temp 98.2°F | Wt 174.0 lb

## 2021-12-26 DIAGNOSIS — Z1231 Encounter for screening mammogram for malignant neoplasm of breast: Secondary | ICD-10-CM

## 2021-12-26 NOTE — Patient Instructions (Addendum)
If you have any concerns or questions, please feel free to call our office. Follow up in 1 year. ? ?Breast Self-Awareness ?Breast self-awareness is knowing how your breasts look and feel. You need to: ?Check your breasts on a regular basis. ?Tell your doctor about any changes. ?Become familiar with the look and feel of your breasts. This can help you catch a breast problem while it is still small and can be treated. You should do breast self-exams even if you have breast implants. ?What you need: ?A mirror. ?A well-lit room. ?A pillow or other soft object. ?How to do a breast self-exam ?Follow these steps to do a breast self-exam: ?Look for changes ? ?Take off all the clothes above your waist. ?Stand in front of a mirror in a room with good lighting. ?Put your hands down at your sides. ?Compare your breasts in the mirror. Look for any difference between them, such as: ?A difference in shape. ?A difference in size. ?Wrinkles, dips, and bumps in one breast and not the other. ?Look at each breast for changes in the skin, such as: ?Redness. ?Scaly areas. ?Skin that has gotten thicker. ?Dimpling. ?Open sores (ulcers). ?Look for changes in your nipples, such as: ?Fluid coming out of a nipple. ?Fluid around a nipple. ?Bleeding. ?Dimpling. ?Redness. ?A nipple that looks pushed in (retracted), or that has changed position. ?Feel for changes ?Lie on your back. ?Feel each breast. To do this: ?Pick a breast to feel. ?Place a pillow under the shoulder closest to that breast. Put the arm closest to that breast behind your head. ?Feel the nipple area of that breast using the hand of your other arm. Feel the area with the pads of your three middle fingers by making small circles with your fingers. Use light, medium, and firm pressure. ?Continue the overlapping circles, moving downward over the breast. Keep making circles with your fingers. Stop when you feel your ribs. ?Start making circles with your fingers again, this time going  upward until you reach your collarbone. ?Then, make circles outward across your breast and into your armpit area. ?Squeeze your nipple. Check for discharge and lumps. ?Repeat these steps to check your other breast. ?Sit or stand in the tub or shower. ?With soapy water on your skin, feel each breast the same way you did when you were lying down. ?Write down what you find ?Writing down what you find can help you remember what to tell your doctor. Write down: ?What is normal for each breast. ?Any changes you find in each breast. These include: ?The kind of changes you find. ?A tender or painful breast. ?Any lump you find. Write down its size and where it is. ?When you last had your monthly period (menstrual cycle). ?General tips ?If you are breastfeeding, the best time to check your breasts is after you feed your baby or after you use a breast pump. ?If you get monthly bleeding, the best time to check your breasts is 5-7 days after your monthly cycle ends. ?With time, you will become comfortable with the self-exam. You will also start to know if there are changes in your breasts. ?Contact a doctor if: ?You see a change in the shape or size of your breasts or nipples. ?You see a change in the skin of your breast or nipples, such as red or scaly skin. ?You have fluid coming from your nipples that is not normal. ?You find a new lump or thick area. ?You have breast pain. ?You have   any concerns about your breast health. ?Summary ?Breast self-awareness includes looking for changes in your breasts and feeling for changes within your breasts. ?You should do breast self-awareness in front of a mirror in a well-lit room. ?If you get monthly periods (menstrual cycles), the best time to check your breasts is 5-7 days after your period ends. ?Tell your doctor about any changes you see in your breasts. Changes include changes in size, changes on the skin, painful or tender breasts, or fluid from your nipples that is not  normal. ?This information is not intended to replace advice given to you by your health care provider. Make sure you discuss any questions you have with your health care provider. ?Document Revised: 06/14/2021 Document Reviewed: 06/14/2021 ?Elsevier Patient Education ? 2023 Elsevier Inc[employer] ? ?

## 2021-12-27 ENCOUNTER — Encounter: Payer: Self-pay | Admitting: Surgery

## 2021-12-27 NOTE — Progress Notes (Signed)
Outpatient Surgical Follow Up ? ?12/27/2021 ? ?[name] is an 51 y.o. female.  ? ?Chief Complaint  ?Patient presents with  ? Follow-up  ?  1 year breast mammo  ? ? ?HPI: [name] is a 51 year old female with a history of fibroadenoma on the right breast that was biopsied by Dr.Byrnett in 2019.  She now comes for yearly physical exam.  I have personally reviewed her mammogram showing no evidence of any suspicious lesions.  She denies any breast lesions.  No fevers no chills no breast discharge.  No weight loss. 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 Jamal Collin in 2013. ?She Does have significant history of colorectal cancer in her family.  Her colonoscopy was nml. ?SHe does have 2 sisters with diagnosis of breast cancer. ?He has no complaints today.  No changes in health history.  Still very active. ?Recent lab work to include CBC and CMP was normal ? ?Past Medical History:  ?Diagnosis Date  ? Allergy   ? Breast fibroadenoma   ? Diverticulitis   ? GERD (gastroesophageal reflux disease)   ? Hypertension   ? Migraines   ? Migraines   ? Premenstrual dysphoric disorder   ? ? ?Past Surgical History:  ?Procedure Laterality Date  ? BACK SURGERY  3/07  ? c6-c7 fused   ? BREAST BIOPSY Left 2013  ? negative with clip  ? BREAST BIOPSY Right 08/11/2018  ? BX with Dr. Bary Castilla, ribbon marker, FIBROADENOMA WITH FOCAL USUAL DUCT EPITHELIAL HYPERPLASIA  ? BREAST EXCISIONAL BIOPSY Bilateral yrs ago  ? benign  ? CERVICAL FUSION    ? C6-7  ? COLONOSCOPY    ? DILATION AND CURETTAGE OF UTERUS  10/20/14  ? TONSILLECTOMY    ? Uterine ablation  5/16  ? Dr De Burrs  ? wisdom teeth removal   1996  ? ? ?Family History  ?Problem Relation Age of Onset  ? Colon polyps Mother   ? Atrial fibrillation Mother   ? Congestive Heart Failure Mother   ? Coronary artery disease Father   ? Hypertension Father   ? Lung cancer Father   ? Breast cancer Sister 26  ? Colon cancer Maternal Grandmother   ? Coronary artery  disease Paternal Grandmother   ? Colon cancer Paternal Grandfather   ? Colon cancer Maternal Uncle   ? ? ?Social History:  reports that she has never smoked. She has never used smokeless tobacco. She reports that she does not drink alcohol and does not use drugs. ? ?Allergies:  ?Allergies  ?Allergen Reactions  ? Codeine Sulfate   ?  REACTION: Nausea  ? ? ?Medications reviewed. ? ? ? ?ROS ?Full ROS performed and is otherwise negative other than what is stated in HPI ? ? ?BP 127/74   Pulse (!) 109   Temp 98.2 ?F (36.8 ?C) (Oral)   Wt 174 lb (78.9 kg)   SpO2 96%   BMI 29.64 kg/m?  ? ?Physical Exam ?Physical Exam ?Vitals and nursing note reviewed. Exam conducted with a chaperone present.  ?Constitutional:   ?   General: She is not in acute distress. ?   Appearance: Normal appearance. She is normal weight.  ?Cardiovascular:  ?   Rate and Rhythm: Normal rate and regular rhythm.  ?   Heart sounds: No murmur heard. ?No friction rub.  ?Pulmonary:  ?   Effort: Pulmonary effort is normal. No respiratory distress.  ?   Breath sounds: Normal breath  sounds. No stridor. No wheezing.  ?   Comments: BREAST: No evidence of palpable lesions, prior lumpectomy scars bilaterally. No LAD. ?Abdominal:  ?   General: Abdomen is flat. There is no distension.  ?   Palpations: Abdomen is soft. There is no mass.  ?   Tenderness: There is no abdominal tenderness. There is no guarding or rebound.  ?   Hernia: No hernia is present.  ?Musculoskeletal:     ?   General: No swelling or tenderness. Normal range of motion.  ?   Cervical back: Normal range of motion and neck supple. No rigidity or tenderness.  ?Lymphadenopathy:  ?   Cervical: No cervical adenopathy.  ?Skin: ?   General: Skin is warm and dry.  ?   Capillary Refill: Capillary refill takes less than 2 seconds.  ?Neurological:  ?   General: No focal deficit present.  ?   Mental Status: She is alert and oriented to person, place, and time.  ?Psychiatric:     ?   Mood and Affect: Mood  normal.     ?   Behavior: Behavior normal.     ?   Thought Content: Thought content normal.     ?   Judgment: Judgment normal.  ?  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  There is no evidence of any new concerning lesions on physical exam nor imaging studies.  We will continue yearly exams with mammograms.  No need for any surgical intervention or further w/u at this time. ? ?Please note that I spent 30 minutes in this encounter including personally reviewing her imaging studies, coordinating her care, placing orders, counseling the patient and performing appropriate documentation ? ?Caroleen Hamman, MD FACS ?General Surgeon  ?

## 2022-02-04 ENCOUNTER — Other Ambulatory Visit: Payer: Self-pay | Admitting: Internal Medicine

## 2022-02-13 DIAGNOSIS — M180 Bilateral primary osteoarthritis of first carpometacarpal joints: Secondary | ICD-10-CM | POA: Diagnosis not present

## 2022-03-05 ENCOUNTER — Encounter: Payer: Self-pay | Admitting: Internal Medicine

## 2022-03-05 ENCOUNTER — Ambulatory Visit (INDEPENDENT_AMBULATORY_CARE_PROVIDER_SITE_OTHER): Payer: BC Managed Care – PPO | Admitting: Internal Medicine

## 2022-03-05 VITALS — BP 130/86 | HR 84 | Temp 97.0°F | Ht 64.0 in | Wt 173.0 lb

## 2022-03-05 DIAGNOSIS — J4520 Mild intermittent asthma, uncomplicated: Secondary | ICD-10-CM

## 2022-03-05 DIAGNOSIS — N951 Menopausal and female climacteric states: Secondary | ICD-10-CM | POA: Diagnosis not present

## 2022-03-05 DIAGNOSIS — M19041 Primary osteoarthritis, right hand: Secondary | ICD-10-CM

## 2022-03-05 DIAGNOSIS — I10 Essential (primary) hypertension: Secondary | ICD-10-CM | POA: Diagnosis not present

## 2022-03-05 DIAGNOSIS — Z0000 Encounter for general adult medical examination without abnormal findings: Secondary | ICD-10-CM | POA: Diagnosis not present

## 2022-03-05 DIAGNOSIS — M19042 Primary osteoarthritis, left hand: Secondary | ICD-10-CM

## 2022-03-05 LAB — CBC
HCT: 41 % (ref 36.0–46.0)
Hemoglobin: 13.6 g/dL (ref 12.0–15.0)
MCHC: 33.2 g/dL (ref 30.0–36.0)
MCV: 83.2 fl (ref 78.0–100.0)
Platelets: 296 10*3/uL (ref 150.0–400.0)
RBC: 4.92 Mil/uL (ref 3.87–5.11)
RDW: 13.5 % (ref 11.5–15.5)
WBC: 5.8 10*3/uL (ref 4.0–10.5)

## 2022-03-05 LAB — COMPREHENSIVE METABOLIC PANEL
ALT: 30 U/L (ref 0–35)
AST: 22 U/L (ref 0–37)
Albumin: 4.2 g/dL (ref 3.5–5.2)
Alkaline Phosphatase: 119 U/L — ABNORMAL HIGH (ref 39–117)
BUN: 15 mg/dL (ref 6–23)
CO2: 31 mEq/L (ref 19–32)
Calcium: 9.2 mg/dL (ref 8.4–10.5)
Chloride: 102 mEq/L (ref 96–112)
Creatinine, Ser: 0.78 mg/dL (ref 0.40–1.20)
GFR: 87.03 mL/min (ref >60.00)
Glucose, Bld: 90 mg/dL (ref 70–99)
Potassium: 4.6 mEq/L (ref 3.5–5.1)
Sodium: 140 mEq/L (ref 135–145)
Total Bilirubin: 0.5 mg/dL (ref 0.2–1.2)
Total Protein: 6.7 g/dL (ref 6.0–8.3)

## 2022-03-05 LAB — LIPID PANEL
Cholesterol: 211 mg/dL — ABNORMAL HIGH (ref 0–200)
HDL: 58.2 mg/dL (ref >39.00)
LDL Cholesterol: 128 mg/dL — ABNORMAL HIGH (ref 0–99)
NonHDL: 153.07
Total CHOL/HDL Ratio: 4
Triglycerides: 127 mg/dL (ref 0.0–149.0)
VLDL: 25.4 mg/dL (ref 0.0–40.0)

## 2022-03-05 LAB — T4, FREE: Free T4: 0.69 ng/dL (ref 0.60–1.60)

## 2022-03-05 MED ORDER — ALBUTEROL SULFATE HFA 108 (90 BASE) MCG/ACT IN AERS: 2.0000 | INHALATION_SPRAY | Freq: Four times a day (QID) | RESPIRATORY_TRACT | 1 refills | Status: DC | PRN

## 2022-03-05 MED ORDER — BUTALBITAL-APAP-CAFFEINE 50-325-40 MG PO TABS: 1.0000 | ORAL_TABLET | Freq: Every day | ORAL | 0 refills | Status: DC | PRN

## 2022-03-05 NOTE — Assessment & Plan Note (Signed)
Mostly exercise or infection induced

## 2022-03-05 NOTE — Assessment & Plan Note (Signed)
BP Readings from Last 3 Encounters:  03/05/22 130/86  12/26/21 127/74  02/28/21 140/88   Good control on losartan 100 and HCTZ 25 Will check labs

## 2022-03-05 NOTE — Progress Notes (Signed)
Subjective:    Patient ID: [name], female    DOB: 07/22/1971, 51 y.o.   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---based on x-rays Just started on meloxicam '15mg'$  daily (instead of aleve) It does help--better than aleve  Does get cough/bronchospasm--has the neb for prn Generally just uses the albuterol (and uses this before exercise)  Still doing well with menopausal symptoms on the venlafaxine Rare mild hot flashes on this Not having any mood problems off the sertraline  Current Outpatient Medications on File Prior to Visit  Medication Sig Dispense Refill   albuterol (VENTOLIN HFA) 108 (90 Base) MCG/ACT inhaler Inhale 2 puffs into the lungs every 6 (six) hours as needed for wheezing or shortness of breath. 18 g 1   aspirin 81 MG EC tablet      butalbital-acetaminophen-caffeine (FIORICET) 50-325-40 MG tablet TAKE ONE TABLET BY MOUTH EVERY 4 HOURS AS NEEDED FOR HEADACHE 30 tablet 0   cetirizine (ZYRTEC) 10 MG tablet Take 10 mg by mouth daily.     Glycerin-Polysorbate 80 (REFRESH DRY EYE THERAPY OP) Apply to eye.     hydrochlorothiazide (HYDRODIURIL) 25 MG tablet TAKE ONE TABLET BY MOUTH EVERY DAY 90 tablet 3   ipratropium-albuterol (DUONEB) 0.5-2.5 (3) MG/3ML SOLN Take 3 mLs by nebulization every 6 (six) hours as needed. 120 mL 1   losartan (COZAAR) 100 MG tablet TAKE ONE TABLET BY MOUTH EVERY DAY 90 tablet 3   Multiple Vitamin (MULTIVITAMIN) capsule Take 1 capsule by mouth daily.     NON FORMULARY Phytoestrogen 2 tablets daily     omeprazole (PRILOSEC) 20 MG capsule TAKE 1 CAPSULE BY MOUTH TWICE DAILY AS DIRECTED BEFORE A MEAL 180 capsule 3   potassium chloride SA (KLOR-CON) 20 MEQ tablet TAKE ONE-HALF TABLET BY MOUTH EVERY DAY 90 tablet 3   venlafaxine XR (EFFEXOR-XR) 150 MG 24 hr capsule TAKE 1 CAPSULE BY MOUTH ONCE DAILY WITH BREAKFAST 30 capsule 1   meloxicam (MOBIC) 15 MG tablet Take 15 mg by mouth daily.      No current facility-administered medications on file prior to visit.    Allergies  Allergen Reactions   Codeine Sulfate     REACTION: Nausea    Past Medical History:  Diagnosis Date   Allergy    Breast fibroadenoma    Diverticulitis    GERD (gastroesophageal reflux disease)    Hypertension    Migraines    Migraines    Premenstrual dysphoric disorder     Past Surgical History:  Procedure Laterality Date   BACK SURGERY  3/07   c6-c7 fused    BREAST BIOPSY Left 2013   negative with clip   BREAST BIOPSY Right 08/11/2018   BX with Dr. Bary Castilla, ribbon marker, FIBROADENOMA WITH FOCAL USUAL DUCT EPITHELIAL HYPERPLASIA   BREAST EXCISIONAL BIOPSY Bilateral yrs ago   benign   CERVICAL FUSION     C6-7   COLONOSCOPY     DILATION AND CURETTAGE OF UTERUS  10/20/14   TONSILLECTOMY     Uterine ablation  5/16   Dr De Burrs   wisdom teeth removal   1996    Family History  Problem Relation Age of Onset   Colon polyps Mother    Atrial fibrillation Mother    Congestive Heart Failure Mother    Coronary artery disease Father    Hypertension Father    Lung cancer Father    Breast cancer Sister  39   Colon cancer Maternal Grandmother    Coronary artery disease Paternal Grandmother    Colon cancer Paternal Grandfather    Colon cancer Maternal Uncle     Social History   Socioeconomic History   Marital status: Married    Spouse name: Not on file   Number of children: 2   Years of education: Not on file   Highest education level: Not on file  Occupational History   Occupation:  Hospice nurse   Occupation:    Tobacco Use   Smoking status: Never    Passive exposure: Past (as a child)   Smokeless tobacco: Never  Substance and Sexual Activity   Alcohol use: No    Alcohol/week: 0.0 standard drinks of alcohol   Drug use: No   Sexual activity: Not on file  Other Topics Concern   Not on file  Social History Narrative   Divorced 2012--- remarried 2015   Social  Determinants of Health   Financial Resource Strain: Not on file  Food Insecurity: Not on file  Transportation Needs: Not on file  Physical Activity: Not on file  Stress: Not on file  Social Connections: Not on file  Intimate Partner Violence: Not on file   Review of Systems  Constitutional:  Negative for fatigue and unexpected weight change.       Still exercises regularly Wears seat belt  HENT:  Negative for dental problem.        Has a noise in left ear--but hearing okay Keeps up with dentist  Eyes:  Negative for visual disturbance.       No diplopia or unilateral vision loss  Respiratory:  Negative for cough, chest tightness and shortness of breath.        No wheezing as long as she uses albuterol before exercise  Cardiovascular:  Negative for chest pain, palpitations and leg swelling.  Gastrointestinal:  Negative for blood in stool and constipation.       Rare heartburn on omeprazole Rare issues swallowing a big pill--passes okay  Endocrine: Negative for polydipsia and polyuria.  Genitourinary:  Negative for dyspareunia, dysuria and hematuria.  Musculoskeletal:  Positive for arthralgias. Negative for back pain and joint swelling.       Just the hands  Skin:  Negative for rash.       Derm visits every 6 months since [facility]  Allergic/Immunologic: Positive for environmental allergies. Negative for immunocompromised state.       Zyrtec controls symptoms  Neurological:  Negative for dizziness, syncope and light-headedness.       More headaches in menopause---fioricet does help  Hematological:  Negative for adenopathy. Does not bruise/bleed easily.  Psychiatric/Behavioral:  Negative for dysphoric mood. The patient is not nervous/anxious.        Sleeping better with the menopause med       Objective:   Physical Exam Constitutional:      Appearance: Normal appearance.  HENT:     Mouth/Throat:     Pharynx: No oropharyngeal exudate or posterior oropharyngeal erythema.  Eyes:      Conjunctiva/sclera: Conjunctivae normal.     Pupils: Pupils are equal, round, and reactive to light.  Cardiovascular:     Rate and Rhythm: Normal rate and regular rhythm.     Pulses: Normal pulses.     Heart sounds: No murmur heard.    No gallop.  Pulmonary:     Effort: Pulmonary effort is normal.     Breath sounds: Normal breath sounds. No  wheezing or rales.  Abdominal:     Palpations: Abdomen is soft.     Tenderness: There is no abdominal tenderness.  Musculoskeletal:     Cervical back: Neck supple.     Right lower leg: No edema.     Left lower leg: No edema.     Comments: Mild DIP thickening but no active synovitis in hands  Lymphadenopathy:     Cervical: No cervical adenopathy.  Skin:    Findings: No lesion or rash.  Neurological:     General: No focal deficit present.     Mental Status: She is alert and oriented to person, place, and time.  Psychiatric:        Mood and Affect: Mood normal.        Behavior: Behavior normal.            Assessment & Plan:

## 2022-03-05 NOTE — Assessment & Plan Note (Signed)
Healthy Exercises regularly Pap due 2027 Colon due 2031 Yearly mammogram Updated COVID and flu vaccines in the fall

## 2022-03-05 NOTE — Assessment & Plan Note (Signed)
Using meloxicam 15, topical diclofenac Discussed trying glucosamine/chondroitin and other topical OTC preps

## 2022-03-05 NOTE — Assessment & Plan Note (Signed)
Doing well with the venlafaxine '150mg'$  at bedtime

## 2022-03-18 ENCOUNTER — Other Ambulatory Visit: Payer: Self-pay | Admitting: Internal Medicine

## 2022-04-02 ENCOUNTER — Other Ambulatory Visit: Payer: Self-pay | Admitting: Internal Medicine

## 2022-04-03 DIAGNOSIS — Z85828 Personal history of other malignant neoplasm of skin: Secondary | ICD-10-CM | POA: Diagnosis not present

## 2022-04-03 DIAGNOSIS — D2261 Melanocytic nevi of right upper limb, including shoulder: Secondary | ICD-10-CM | POA: Diagnosis not present

## 2022-04-03 DIAGNOSIS — L538 Other specified erythematous conditions: Secondary | ICD-10-CM | POA: Diagnosis not present

## 2022-04-03 DIAGNOSIS — L820 Inflamed seborrheic keratosis: Secondary | ICD-10-CM | POA: Diagnosis not present

## 2022-04-03 DIAGNOSIS — D225 Melanocytic nevi of trunk: Secondary | ICD-10-CM | POA: Diagnosis not present

## 2022-04-03 DIAGNOSIS — D2272 Melanocytic nevi of left lower limb, including hip: Secondary | ICD-10-CM | POA: Diagnosis not present

## 2022-04-10 DIAGNOSIS — M26623 Arthralgia of bilateral temporomandibular joint: Secondary | ICD-10-CM | POA: Diagnosis not present

## 2022-04-10 DIAGNOSIS — H6983 Other specified disorders of Eustachian tube, bilateral: Secondary | ICD-10-CM | POA: Diagnosis not present

## 2022-04-10 DIAGNOSIS — H9313 Tinnitus, bilateral: Secondary | ICD-10-CM | POA: Diagnosis not present

## 2022-04-30 ENCOUNTER — Other Ambulatory Visit: Payer: Self-pay | Admitting: Internal Medicine

## 2022-04-30 ENCOUNTER — Ambulatory Visit: Payer: BC Managed Care – PPO | Admitting: Internal Medicine

## 2022-04-30 ENCOUNTER — Encounter: Payer: Self-pay | Admitting: Internal Medicine

## 2022-04-30 DIAGNOSIS — L989 Disorder of the skin and subcutaneous tissue, unspecified: Secondary | ICD-10-CM | POA: Diagnosis not present

## 2022-04-30 MED ORDER — CEPHALEXIN 500 MG PO CAPS: 500.0000 mg | ORAL_CAPSULE | Freq: Three times a day (TID) | ORAL | 0 refills | Status: DC

## 2022-04-30 NOTE — Progress Notes (Signed)
Subjective:    Patient ID: [name], female    DOB: 09-Nov-1970, 51 y.o.   MRN: [id number]  HPI Here due to skin lesion to be checked--top of left leg  Thinks this is a cyst--worried about it worsening during upcoming vacation Noticed it 5 days ago Some tenderness---not otherwise painful At first--"felt like a marble"-- so did some hot compresses and got some pus out (white stuff)  Current Outpatient Medications on File Prior to Visit  Medication Sig Dispense Refill   albuterol (VENTOLIN HFA) 108 (90 Base) MCG/ACT inhaler Inhale 2 puffs into the lungs every 6 (six) hours as needed for wheezing or shortness of breath. 18 g 1   aspirin 81 MG EC tablet      butalbital-acetaminophen-caffeine (FIORICET) 50-325-40 MG tablet Take 1 tablet by mouth daily as needed for headache. 30 tablet 0   fexofenadine (ALLEGRA) 180 MG tablet Take 180 mg by mouth daily.     Glycerin-Polysorbate 80 (REFRESH DRY EYE THERAPY OP) Apply to eye.     hydrochlorothiazide (HYDRODIURIL) 25 MG tablet TAKE ONE TABLET BY MOUTH EVERY DAY 90 tablet 3   ipratropium-albuterol (DUONEB) 0.5-2.5 (3) MG/3ML SOLN Take 3 mLs by nebulization every 6 (six) hours as needed. 120 mL 1   losartan (COZAAR) 100 MG tablet TAKE ONE TABLET BY MOUTH EVERY DAY 90 tablet 3   meloxicam (MOBIC) 15 MG tablet Take 15 mg by mouth daily.     Multiple Vitamin (MULTIVITAMIN) capsule Take 1 capsule by mouth daily.     NON FORMULARY Phytoestrogen 2 tablets daily     omeprazole (PRILOSEC) 20 MG capsule TAKE 1 CAPSULE BY MOUTH TWICE DAILY AS DIRECTED BEFORE A MEAL 180 capsule 3   potassium chloride SA (KLOR-CON) 20 MEQ tablet TAKE ONE-HALF TABLET BY MOUTH EVERY DAY 90 tablet 3   venlafaxine XR (EFFEXOR-XR) 150 MG 24 hr capsule TAKE 1 CAPSULE BY MOUTH ONCE DAILY WITH BREAKFAST 90 capsule 3   No current facility-administered medications on file prior to visit.    Allergies  Allergen Reactions   Codeine Sulfate     REACTION: Nausea    Past Medical  History:  Diagnosis Date   Allergy    Breast fibroadenoma    Diverticulitis    GERD (gastroesophageal reflux disease)    Hypertension    Migraines    Migraines    Premenstrual dysphoric disorder     Past Surgical History:  Procedure Laterality Date   BACK SURGERY  3/07   c6-c7 fused    BREAST BIOPSY Left 2013   negative with clip   BREAST BIOPSY Right 08/11/2018   BX with Dr. Bary Castilla, ribbon marker, FIBROADENOMA WITH FOCAL USUAL DUCT EPITHELIAL HYPERPLASIA   BREAST EXCISIONAL BIOPSY Bilateral yrs ago   benign   CERVICAL FUSION     C6-7   COLONOSCOPY     DILATION AND CURETTAGE OF UTERUS  10/20/14   TONSILLECTOMY     Uterine ablation  5/16   Dr De Burrs   wisdom teeth removal   1996    Family History  Problem Relation Age of Onset   Colon polyps Mother    Atrial fibrillation Mother    Congestive Heart Failure Mother    Coronary artery disease Father    Hypertension Father    Lung cancer Father    Breast cancer Sister 36   Colon cancer Maternal Grandmother    Coronary artery disease Paternal Grandmother    Colon cancer Paternal Grandfather    Colon  cancer Maternal Uncle     Social History   Socioeconomic History   Marital status: Married    Spouse name: Not on file   Number of children: 2   Years of education: Not on file   Highest education level: Not on file  Occupational History   Occupation:  Hospice nurse   Occupation:    Tobacco Use   Smoking status: Never    Passive exposure: Past (as a child)   Smokeless tobacco: Never  Substance and Sexual Activity   Alcohol use: No    Alcohol/week: 0.0 standard drinks of alcohol   Drug use: No   Sexual activity: Not on file  Other Topics Concern   Not on file  Social History Narrative   Divorced 2012--- remarried 2015   Social Determinants of Health   Financial Resource Strain: Not on file  Food Insecurity: Not on file  Transportation Needs: Not on file  Physical Activity: Not on file   Stress: Not on file  Social Connections: Not on file  Intimate Partner Violence: Not on file   Review of Systems No fever No tight clothes     Objective:   Physical Exam Skin:    Comments: ~42m irregular area on upper left thigh No clear cyst Not inflamed now            Assessment & Plan:

## 2022-04-30 NOTE — Assessment & Plan Note (Signed)
Likely a cyst--since she did get some drainage Not inflamed now Will give cephalexin 500 tid x 5 days---to take on vacation in case it gets infected If not clear--should have derm check it next time

## 2022-05-01 ENCOUNTER — Encounter: Payer: Self-pay | Admitting: Internal Medicine

## 2022-05-01 MED ORDER — MELOXICAM 15 MG PO TABS
15.0000 mg | ORAL_TABLET | Freq: Every day | ORAL | 3 refills | Status: DC | PRN
Start: 2022-05-01 — End: 2023-01-27

## 2022-08-07 DIAGNOSIS — R040 Epistaxis: Secondary | ICD-10-CM | POA: Diagnosis not present

## 2022-08-07 DIAGNOSIS — J340 Abscess, furuncle and carbuncle of nose: Secondary | ICD-10-CM | POA: Diagnosis not present

## 2022-09-11 ENCOUNTER — Ambulatory Visit: Payer: BC Managed Care – PPO | Admitting: Internal Medicine

## 2022-09-11 ENCOUNTER — Encounter: Payer: Self-pay | Admitting: Internal Medicine

## 2022-09-11 VITALS — BP 146/100 | HR 99 | Temp 97.7°F | Ht 64.0 in | Wt 174.0 lb

## 2022-09-11 DIAGNOSIS — I10 Essential (primary) hypertension: Secondary | ICD-10-CM

## 2022-09-11 LAB — COMPREHENSIVE METABOLIC PANEL
ALT: 30 U/L (ref 0–35)
AST: 24 U/L (ref 0–37)
Albumin: 4.4 g/dL (ref 3.5–5.2)
Alkaline Phosphatase: 96 U/L (ref 39–117)
BUN: 18 mg/dL (ref 6–23)
CO2: 31 mEq/L (ref 19–32)
Calcium: 9.3 mg/dL (ref 8.4–10.5)
Chloride: 100 mEq/L (ref 96–112)
Creatinine, Ser: 0.89 mg/dL (ref 0.40–1.20)
GFR: 74.01 mL/min (ref >60.00)
Glucose, Bld: 86 mg/dL (ref 70–99)
Potassium: 4.5 mEq/L (ref 3.5–5.1)
Sodium: 139 mEq/L (ref 135–145)
Total Bilirubin: 0.4 mg/dL (ref 0.2–1.2)
Total Protein: 7.6 g/dL (ref 6.0–8.3)

## 2022-09-11 LAB — CBC
HCT: 41.4 % (ref 36.0–46.0)
Hemoglobin: 14.1 g/dL (ref 12.0–15.0)
MCHC: 33.9 g/dL (ref 30.0–36.0)
MCV: 82.9 fl (ref 78.0–100.0)
Platelets: 339 10*3/uL (ref 150.0–400.0)
RBC: 4.99 Mil/uL (ref 3.87–5.11)
RDW: 13.9 % (ref 11.5–15.5)
WBC: 7.5 10*3/uL (ref 4.0–10.5)

## 2022-09-11 LAB — MICROALBUMIN / CREATININE URINE RATIO
Creatinine,U: 136.9 mg/dL
Microalb Creat Ratio: 2.4 mg/g (ref 0.0–30.0)
Microalb, Ur: 3.2 mg/dL — ABNORMAL HIGH (ref 0.0–1.9)

## 2022-09-11 MED ORDER — AMLODIPINE BESYLATE 5 MG PO TABS: 5.0000 mg | ORAL_TABLET | Freq: Every day | ORAL | 3 refills | Status: DC

## 2022-09-11 NOTE — Progress Notes (Signed)
Subjective:    Patient ID: [name], female    DOB: 31-Oct-1970, 52 y.o.   MRN: [id number]  HPI Here due to elevations of blood pressure  Even feels bad with it---eyes hurting and headache Realized it was high with seeing ENT last month (for nosebleeds). Very high there (178/124) She has been monitoring it since then 146/95 to 178/120  No chest pain--but has "a knot" type feeling at times (or in head) No racing heart Mild dizziness--but no syncope No edema  Current Outpatient Medications on File Prior to Visit  Medication Sig Dispense Refill   albuterol (VENTOLIN HFA) 108 (90 Base) MCG/ACT inhaler Inhale 2 puffs into the lungs every 6 (six) hours as needed for wheezing or shortness of breath. 18 g 1   aspirin 81 MG EC tablet      butalbital-acetaminophen-caffeine (FIORICET) 50-325-40 MG tablet Take 1 tablet by mouth daily as needed for headache. 30 tablet 0   fexofenadine (ALLEGRA) 180 MG tablet Take 180 mg by mouth daily.     Glycerin-Polysorbate 80 (REFRESH DRY EYE THERAPY OP) Apply to eye.     hydrochlorothiazide (HYDRODIURIL) 25 MG tablet TAKE ONE TABLET BY MOUTH EVERY DAY 90 tablet 3   ipratropium-albuterol (DUONEB) 0.5-2.5 (3) MG/3ML SOLN Take 3 mLs by nebulization every 6 (six) hours as needed. 120 mL 1   losartan (COZAAR) 100 MG tablet TAKE ONE TABLET BY MOUTH EVERY DAY 90 tablet 3   Melatonin 10 MG TABS Take by mouth.     meloxicam (MOBIC) 15 MG tablet Take 1 tablet (15 mg total) by mouth daily as needed for pain. 90 tablet 3   Multiple Vitamin (MULTIVITAMIN) capsule Take 1 capsule by mouth daily.     NON FORMULARY Phytoestrogen 2 tablets daily     omeprazole (PRILOSEC) 20 MG capsule TAKE 1 CAPSULE BY MOUTH TWICE DAILY AS DIRECTED BEFORE A MEAL 180 capsule 3   potassium chloride SA (KLOR-CON M) 20 MEQ tablet TAKE ONE-HALF TABLET BY MOUTH EVERY DAY 90 tablet 3   venlafaxine XR (EFFEXOR-XR) 150 MG 24 hr capsule TAKE 1 CAPSULE BY MOUTH ONCE DAILY WITH BREAKFAST 90 capsule  3   No current facility-administered medications on file prior to visit.    Allergies  Allergen Reactions   Codeine Sulfate     REACTION: Nausea    Past Medical History:  Diagnosis Date   Allergy    Breast fibroadenoma    Diverticulitis    GERD (gastroesophageal reflux disease)    Hypertension    Migraines    Migraines    Premenstrual dysphoric disorder     Past Surgical History:  Procedure Laterality Date   BACK SURGERY  3/07   c6-c7 fused    BREAST BIOPSY Left 2013   negative with clip   BREAST BIOPSY Right 08/11/2018   BX with Dr. Bary Castilla, ribbon marker, FIBROADENOMA WITH FOCAL USUAL DUCT EPITHELIAL HYPERPLASIA   BREAST EXCISIONAL BIOPSY Bilateral yrs ago   benign   CERVICAL FUSION     C6-7   COLONOSCOPY     DILATION AND CURETTAGE OF UTERUS  10/20/14   TONSILLECTOMY     Uterine ablation  5/16   Dr De Burrs   wisdom teeth removal   1996    Family History  Problem Relation Age of Onset   Colon polyps Mother    Atrial fibrillation Mother    Congestive Heart Failure Mother    Coronary artery disease Father    Hypertension Father  Lung cancer Father    Breast cancer Sister 21   Colon cancer Maternal Grandmother    Coronary artery disease Paternal Grandmother    Colon cancer Paternal Grandfather    Colon cancer Maternal Uncle     Social History   Socioeconomic History   Marital status: Married    Spouse name: Not on file   Number of children: 2   Years of education: Not on file   Highest education level: Not on file  Occupational History   Occupation:  Hospice nurse   Occupation:    Tobacco Use   Smoking status: Never    Passive exposure: Past (as a child)   Smokeless tobacco: Never  Substance and Sexual Activity   Alcohol use: No    Alcohol/week: 0.0 standard drinks of alcohol   Drug use: No   Sexual activity: Not on file  Other Topics Concern   Not on file  Social History Narrative   Divorced 2012--- remarried 2015   Social  Determinants of Health   Financial Resource Strain: Not on file  Food Insecurity: Not on file  Transportation Needs: Not on file  Physical Activity: Not on file  Stress: Not on file  Social Connections: Not on file  Intimate Partner Violence: Not on file    Review of Systems Voiding okay Sleeps okay---some disruption from hot flashes No new work or home stress    Objective:   Physical Exam Constitutional:      Appearance: Normal appearance.  Cardiovascular:     Rate and Rhythm: Normal rate and regular rhythm.     Heart sounds: No murmur heard.    No gallop.  Pulmonary:     Effort: Pulmonary effort is normal.     Breath sounds: Normal breath sounds. No wheezing or rales.  Musculoskeletal:     Cervical back: Neck supple.     Right lower leg: No edema.     Left lower leg: No edema.  Lymphadenopathy:     Cervical: No cervical adenopathy.  Neurological:     Mental Status: She is alert.  Psychiatric:        Mood and Affect: Mood normal.        Behavior: Behavior normal.            Assessment & Plan:

## 2022-09-11 NOTE — Patient Instructions (Signed)

## 2022-09-11 NOTE — Assessment & Plan Note (Signed)
BP Readings from Last 3 Encounters:  09/11/22 (!) 146/100  04/30/22 128/84  03/05/22 130/86   Much higher without clear reason Avoids salt Discussed DASH eating Will add amlodipine '5mg'$  to losartan '100mg'$  and HCTZ 25 Check labs

## 2022-10-16 ENCOUNTER — Encounter: Payer: Self-pay | Admitting: Internal Medicine

## 2022-10-16 ENCOUNTER — Ambulatory Visit: Payer: BC Managed Care – PPO | Admitting: Internal Medicine

## 2022-10-16 VITALS — BP 114/84 | HR 92 | Temp 97.6°F | Ht 64.0 in | Wt 169.0 lb

## 2022-10-16 DIAGNOSIS — I10 Essential (primary) hypertension: Secondary | ICD-10-CM | POA: Diagnosis not present

## 2022-10-16 NOTE — Progress Notes (Signed)
Subjective:    Patient ID: [name], female    DOB: 1970/10/25, 52 y.o.   MRN: [id number]  HPI Here for follow up of HTN  Has done well with the amlodipine Did get one episode of dizziness for about 15 minutes--while working Not near syncope--but trouble walking (while at a patient's home)_ Checking at home---- 127/82-151/92---but most are good  Current Outpatient Medications on File Prior to Visit  Medication Sig Dispense Refill   albuterol (VENTOLIN HFA) 108 (90 Base) MCG/ACT inhaler Inhale 2 puffs into the lungs every 6 (six) hours as needed for wheezing or shortness of breath. 18 g 1   amLODipine (NORVASC) 5 MG tablet Take 1 tablet (5 mg total) by mouth daily. 90 tablet 3   aspirin 81 MG EC tablet      butalbital-acetaminophen-caffeine (FIORICET) 50-325-40 MG tablet Take 1 tablet by mouth daily as needed for headache. 30 tablet 0   fexofenadine (ALLEGRA) 180 MG tablet Take 180 mg by mouth daily.     Glycerin-Polysorbate 80 (REFRESH DRY EYE THERAPY OP) Apply to eye.     hydrochlorothiazide (HYDRODIURIL) 25 MG tablet TAKE ONE TABLET BY MOUTH EVERY DAY 90 tablet 3   ipratropium-albuterol (DUONEB) 0.5-2.5 (3) MG/3ML SOLN Take 3 mLs by nebulization every 6 (six) hours as needed. 120 mL 1   losartan (COZAAR) 100 MG tablet TAKE ONE TABLET BY MOUTH EVERY DAY 90 tablet 3   Melatonin 10 MG TABS Take by mouth.     meloxicam (MOBIC) 15 MG tablet Take 1 tablet (15 mg total) by mouth daily as needed for pain. 90 tablet 3   Multiple Vitamin (MULTIVITAMIN) capsule Take 1 capsule by mouth daily.     NON FORMULARY Phytoestrogen 2 tablets daily     omeprazole (PRILOSEC) 20 MG capsule TAKE 1 CAPSULE BY MOUTH TWICE DAILY AS DIRECTED BEFORE A MEAL 180 capsule 3   potassium chloride SA (KLOR-CON M) 20 MEQ tablet TAKE ONE-HALF TABLET BY MOUTH EVERY DAY 90 tablet 3   venlafaxine XR (EFFEXOR-XR) 150 MG 24 hr capsule TAKE 1 CAPSULE BY MOUTH ONCE DAILY WITH BREAKFAST 90 capsule 3   No current  facility-administered medications on file prior to visit.    Allergies  Allergen Reactions   Codeine Sulfate     REACTION: Nausea    Past Medical History:  Diagnosis Date   Allergy    Breast fibroadenoma    Diverticulitis    GERD (gastroesophageal reflux disease)    Hypertension    Migraines    Migraines    Premenstrual dysphoric disorder     Past Surgical History:  Procedure Laterality Date   BACK SURGERY  3/07   c6-c7 fused    BREAST BIOPSY Left 2013   negative with clip   BREAST BIOPSY Right 08/11/2018   BX with Dr. Bary Castilla, ribbon marker, FIBROADENOMA WITH FOCAL USUAL DUCT EPITHELIAL HYPERPLASIA   BREAST EXCISIONAL BIOPSY Bilateral yrs ago   benign   CERVICAL FUSION     C6-7   COLONOSCOPY     DILATION AND CURETTAGE OF UTERUS  10/20/14   TONSILLECTOMY     Uterine ablation  5/16   Dr De Burrs   wisdom teeth removal   1996    Family History  Problem Relation Age of Onset   Colon polyps Mother    Atrial fibrillation Mother    Congestive Heart Failure Mother    Coronary artery disease Father    Hypertension Father    Lung cancer Father  Breast cancer Sister 77   Colon cancer Maternal Grandmother    Coronary artery disease Paternal Grandmother    Colon cancer Paternal Grandfather    Colon cancer Maternal Uncle     Social History   Socioeconomic History   Marital status: Married    Spouse name: Not on file   Number of children: 2   Years of education: Not on file   Highest education level: Not on file  Occupational History   Occupation:  Hospice nurse   Occupation:    Tobacco Use   Smoking status: Never    Passive exposure: Past (as a child)   Smokeless tobacco: Never  Substance and Sexual Activity   Alcohol use: No    Alcohol/week: 0.0 standard drinks of alcohol   Drug use: No   Sexual activity: Not on file  Other Topics Concern   Not on file  Social History Narrative   Divorced 2012--- remarried 2015   Social Determinants of  Health   Financial Resource Strain: Not on file  Food Insecurity: Not on file  Transportation Needs: Not on file  Physical Activity: Not on file  Stress: Not on file  Social Connections: Not on file  Intimate Partner Violence: Not on file   Review of Systems No swelling No chest pain or SOB Still does her work outs Careful with eating--has lost a few pounds     Objective:   Physical Exam Constitutional:      Appearance: Normal appearance.  Cardiovascular:     Rate and Rhythm: Normal rate and regular rhythm.     Heart sounds: No murmur heard.    No gallop.  Pulmonary:     Effort: Pulmonary effort is normal.     Breath sounds: Normal breath sounds. No wheezing or rales.  Musculoskeletal:     Cervical back: Neck supple.     Right lower leg: No edema.     Left lower leg: No edema.  Lymphadenopathy:     Cervical: No cervical adenopathy.  Neurological:     Mental Status: She is alert.            Assessment & Plan:

## 2022-10-16 NOTE — Assessment & Plan Note (Signed)
BP Readings from Last 3 Encounters:  10/16/22 114/84  09/11/22 (!) 146/100  04/30/22 128/84   Better with the amlodipine as well as losartan 100/HCTZ 25

## 2022-11-06 ENCOUNTER — Other Ambulatory Visit: Payer: Self-pay

## 2022-11-06 DIAGNOSIS — Z1231 Encounter for screening mammogram for malignant neoplasm of breast: Secondary | ICD-10-CM

## 2022-12-17 ENCOUNTER — Other Ambulatory Visit: Payer: Self-pay | Admitting: Internal Medicine

## 2022-12-25 ENCOUNTER — Ambulatory Visit
Admission: RE | Admit: 2022-12-25 | Discharge: 2022-12-25 | Disposition: A | Discharge status: Home / Self Care | Payer: BC Managed Care – PPO | Source: Ambulatory Visit | Attending: Surgery | Admitting: Surgery

## 2022-12-25 DIAGNOSIS — Z1231 Encounter for screening mammogram for malignant neoplasm of breast: Secondary | ICD-10-CM | POA: Insufficient documentation

## 2022-12-30 ENCOUNTER — Ambulatory Visit: Payer: BC Managed Care – PPO | Admitting: Surgery

## 2023-01-01 ENCOUNTER — Encounter: Payer: Self-pay | Admitting: Surgery

## 2023-01-01 ENCOUNTER — Ambulatory Visit (INDEPENDENT_AMBULATORY_CARE_PROVIDER_SITE_OTHER): Payer: BC Managed Care – PPO | Admitting: Surgery

## 2023-01-01 VITALS — BP 131/87 | HR 89 | Temp 98.7°F | Ht 64.0 in | Wt 166.4 lb

## 2023-01-01 DIAGNOSIS — Z1231 Encounter for screening mammogram for malignant neoplasm of breast: Secondary | ICD-10-CM | POA: Diagnosis not present

## 2023-01-01 NOTE — Progress Notes (Signed)
Surgical Consultation  01/01/2023  [name] is an 52 y.o. female.   Chief Complaint  Patient presents with   Follow-up    1 yr f/u mammo 12/25/22     HPI: 52 year old female with a history of fibroadenoma on the right breast that was biopsied by Dr.Byrnett in 2019.  She now comes for yearly physical exam.  I have personally reviewed her mammogram showing no evidence of any suspicious lesions.  She denies any breast lesions.  No fevers no chills no breast discharge.  No weight loss. She reports asymmetry left being smaller than right breast. 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 Evette Cristal in 2013. She Does have significant history of colorectal cancer in her family.  Her colonoscopy was nml. SHe does have 2 sisters with diagnosis of breast cancer. He has no complaints today.  No changes in health history.  Still very active. Recent lab work to include CBC and CMP was normal   Past Medical History:  Diagnosis Date   Allergy    Breast fibroadenoma    Diverticulitis    GERD (gastroesophageal reflux disease)    Hypertension    Migraines    Migraines    Premenstrual dysphoric disorder     Past Surgical History:  Procedure Laterality Date   BACK SURGERY  3/07   c6-c7 fused    BREAST BIOPSY Left 2013   negative with clip   BREAST BIOPSY Right 08/11/2018   BX with Dr. Lemar Livings, ribbon marker, FIBROADENOMA WITH FOCAL USUAL DUCT EPITHELIAL HYPERPLASIA   BREAST EXCISIONAL BIOPSY Bilateral yrs ago   benign   CERVICAL FUSION     C6-7   COLONOSCOPY     DILATION AND CURETTAGE OF UTERUS  10/20/14   TONSILLECTOMY     Uterine ablation  5/16   Dr Unice Cobble   wisdom teeth removal   1996    Family History  Problem Relation Age of Onset   Colon polyps Mother    Atrial fibrillation Mother    Congestive Heart Failure Mother    Coronary artery disease Father    Hypertension Father    Lung cancer Father    Breast cancer Sister 61    Colon cancer Maternal Grandmother    Coronary artery disease Paternal Grandmother    Colon cancer Paternal Grandfather    Colon cancer Maternal Uncle     Social History:  reports that she has never smoked. She has been exposed to tobacco smoke. She has never used smokeless tobacco. She reports that she does not drink alcohol and does not use drugs.  Allergies:  Allergies  Allergen Reactions   Codeine Sulfate     REACTION: Nausea    Medications reviewed.     ROS Full ROS performed and is otherwise negative other than what is stated in the HPI    BP 131/87   Pulse 89   Temp 98.7 F (37.1 C) (Oral)   Ht 5\' 4"  (1.626 m)   Wt 166 lb 6.4 oz (75.5 kg)   SpO2 97%   BMI 28.56 kg/m   Physical Exam Vitals and nursing note reviewed. Exam conducted with a chaperone present.  Constitutional:      General: She is not in acute distress.    Appearance: Normal appearance. She is normal weight.  Cardiovascular:     Rate and Rhythm: Normal rate and regular rhythm.     Heart sounds: No murmur heard. No friction  rub.  Pulmonary:     Effort: Pulmonary effort is normal. No respiratory distress.     Breath sounds: Normal breath sounds. No stridor. No wheezing.     Comments: BREAST: No evidence of palpable lesions, prior lumpectomy scars bilaterally. No LAD., slight breast asymmetry w/o being pathologic Abdominal:     General: Abdomen is flat. There is no distension.     Palpations: Abdomen is soft. There is no mass.     Tenderness: There is no abdominal tenderness. There is no guarding or rebound.     Hernia: No hernia is present.  Musculoskeletal:        General: No swelling or tenderness. Normal range of motion.     Cervical back: Normal range of motion and neck supple. No rigidity or tenderness.  Lymphadenopathy:     Cervical: No cervical adenopathy.  Skin:    General: Skin is warm and dry.     Capillary Refill: Capillary refill takes less than 2 seconds.  Neurological:      General: No focal deficit present.     Mental Status: She is alert and oriented to person, place, and time.  Psychiatric:        Mood and Affect: Mood normal.        Behavior: Behavior normal.        Thought Content: Thought content normal.        Judgment: Judgment normal.   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  There is no evidence of any new concerning lesions on physical exam nor imaging studies.  We will continue yearly exams with mammograms.  No need for any surgical intervention or further w/u at this time. D/W pt about slight asymmetry being cause by hormonal changes related to menopause   Please note that I spent 30 minutes in this encounter including personally reviewing her imaging studies, coordinating her care, placing orders, counseling the patient and performing appropriate documentation  Sterling Big, MD [facility] General Surgeon

## 2023-01-01 NOTE — Patient Instructions (Addendum)
If you have any concerns or questions, please feel free to call our office. Follow up in 1 year.   Breast Self-Awareness Breast self-awareness is knowing how your breasts look and feel. You need to: Check your breasts on a regular basis. Tell your doctor about any changes. Become familiar with the look and feel of your breasts. This can help you catch a breast problem while it is still small and can be treated. You should do breast self-exams even if you have breast implants. What you need: A mirror. A well-lit room. A pillow or other soft object. How to do a breast self-exam Follow these steps to do a breast self-exam: Look for changes  Take off all the clothes above your waist. Stand in front of a mirror in a room with good lighting. Put your hands down at your sides. Compare your breasts in the mirror. Look for any difference between them, such as: A difference in shape. A difference in size. Wrinkles, dips, and bumps in one breast and not the other. Look at each breast for changes in the skin, such as: Redness. Scaly areas. Skin that has gotten thicker. Dimpling. Open sores (ulcers). Look for changes in your nipples, such as: Fluid coming out of a nipple. Fluid around a nipple. Bleeding. Dimpling. Redness. A nipple that looks pushed in (retracted), or that has changed position. Feel for changes Lie on your back. Feel each breast. To do this: Pick a breast to feel. Place a pillow under the shoulder closest to that breast. Put the arm closest to that breast behind your head. Feel the nipple area of that breast using the hand of your other arm. Feel the area with the pads of your three middle fingers by making small circles with your fingers. Use light, medium, and firm pressure. Continue the overlapping circles, moving downward over the breast. Keep making circles with your fingers. Stop when you feel your ribs. Start making circles with your fingers again, this time going  upward until you reach your collarbone. Then, make circles outward across your breast and into your armpit area. Squeeze your nipple. Check for discharge and lumps. Repeat these steps to check your other breast. Sit or stand in the tub or shower. With soapy water on your skin, feel each breast the same way you did when you were lying down. Write down what you find Writing down what you find can help you remember what to tell your doctor. Write down: What is normal for each breast. Any changes you find in each breast. These include: The kind of changes you find. A tender or painful breast. Any lump you find. Write down its size and where it is. When you last had your monthly period (menstrual cycle). General tips If you are breastfeeding, the best time to check your breasts is after you feed your baby or after you use a breast pump. If you get monthly bleeding, the best time to check your breasts is 5-7 days after your monthly cycle ends. With time, you will become comfortable with the self-exam. You will also start to know if there are changes in your breasts. Contact a doctor if: You see a change in the shape or size of your breasts or nipples. You see a change in the skin of your breast or nipples, such as red or scaly skin. You have fluid coming from your nipples that is not normal. You find a new lump or thick area. You have breast pain. You   have any concerns about your breast health. Summary Breast self-awareness includes looking for changes in your breasts and feeling for changes within your breasts. You should do breast self-awareness in front of a mirror in a well-lit room. If you get monthly periods (menstrual cycles), the best time to check your breasts is 5-7 days after your period ends. Tell your doctor about any changes you see in your breasts. Changes include changes in size, changes on the skin, painful or tender breasts, or fluid from your nipples that is not  normal. This information is not intended to replace advice given to you by your health care provider. Make sure you discuss any questions you have with your health care provider. Document Revised: 01/17/2022 Document Reviewed: 06/14/2021 Elsevier Patient Education  2023 [employer]  

## 2023-01-27 ENCOUNTER — Other Ambulatory Visit: Payer: Self-pay | Admitting: Internal Medicine

## 2023-03-11 ENCOUNTER — Encounter: Payer: Self-pay | Admitting: Internal Medicine

## 2023-03-11 ENCOUNTER — Ambulatory Visit (INDEPENDENT_AMBULATORY_CARE_PROVIDER_SITE_OTHER): Payer: BC Managed Care – PPO | Admitting: Internal Medicine

## 2023-03-11 VITALS — BP 112/80 | HR 89 | Temp 97.8°F | Ht 63.5 in | Wt 166.0 lb

## 2023-03-11 DIAGNOSIS — Z0000 Encounter for general adult medical examination without abnormal findings: Secondary | ICD-10-CM

## 2023-03-11 DIAGNOSIS — I10 Essential (primary) hypertension: Secondary | ICD-10-CM | POA: Diagnosis not present

## 2023-03-11 DIAGNOSIS — K2100 Gastro-esophageal reflux disease with esophagitis, without bleeding: Secondary | ICD-10-CM

## 2023-03-11 DIAGNOSIS — N951 Menopausal and female climacteric states: Secondary | ICD-10-CM | POA: Diagnosis not present

## 2023-03-11 DIAGNOSIS — J4520 Mild intermittent asthma, uncomplicated: Secondary | ICD-10-CM

## 2023-03-11 LAB — COMPREHENSIVE METABOLIC PANEL
ALT: 24 U/L (ref 0–35)
AST: 17 U/L (ref 0–37)
Albumin: 4.2 g/dL (ref 3.5–5.2)
Alkaline Phosphatase: 93 U/L (ref 39–117)
BUN: 20 mg/dL (ref 6–23)
CO2: 33 mEq/L — ABNORMAL HIGH (ref 19–32)
Calcium: 9.3 mg/dL (ref 8.4–10.5)
Chloride: 102 mEq/L (ref 96–112)
Creatinine, Ser: 0.78 mg/dL (ref 0.40–1.20)
GFR: 86.41 mL/min (ref >60.00)
Glucose, Bld: 92 mg/dL (ref 70–99)
Potassium: 4.1 mEq/L (ref 3.5–5.1)
Sodium: 140 mEq/L (ref 135–145)
Total Bilirubin: 0.5 mg/dL (ref 0.2–1.2)
Total Protein: 6.7 g/dL (ref 6.0–8.3)

## 2023-03-11 LAB — CBC
HCT: 40.2 % (ref 36.0–46.0)
Hemoglobin: 13.4 g/dL (ref 12.0–15.0)
MCHC: 33.4 g/dL (ref 30.0–36.0)
MCV: 83.5 fl (ref 78.0–100.0)
Platelets: 318 10*3/uL (ref 150.0–400.0)
RBC: 4.82 Mil/uL (ref 3.87–5.11)
RDW: 13.5 % (ref 11.5–15.5)
WBC: 5.7 10*3/uL (ref 4.0–10.5)

## 2023-03-11 LAB — VITAMIN B12: Vitamin B-12: 613 pg/mL (ref 211–911)

## 2023-03-11 LAB — LIPID PANEL
Cholesterol: 216 mg/dL — ABNORMAL HIGH (ref 0–200)
HDL: 62.2 mg/dL (ref >39.00)
LDL Cholesterol: 128 mg/dL — ABNORMAL HIGH (ref 0–99)
NonHDL: 153.95
Total CHOL/HDL Ratio: 3
Triglycerides: 132 mg/dL (ref 0.0–149.0)
VLDL: 26.4 mg/dL (ref 0.0–40.0)

## 2023-03-11 LAB — TSH: TSH: 1.47 u[IU]/mL (ref 0.35–5.50)

## 2023-03-11 MED ORDER — LOSARTAN POTASSIUM-HCTZ 100-25 MG PO TABS: 1.0000 | ORAL_TABLET | Freq: Every day | ORAL | 3 refills | Status: DC

## 2023-03-11 NOTE — Assessment & Plan Note (Signed)
Just uses albuterol before exercise Hasn't needed the duoneb

## 2023-03-11 NOTE — Assessment & Plan Note (Signed)
Doing well with the venlafaxine 150mg   No wean for now

## 2023-03-11 NOTE — Assessment & Plan Note (Signed)
BP Readings from Last 3 Encounters:  03/11/23 112/80  01/01/23 131/87  10/16/22 114/84   Controlled with losartan/hydrochlorothiazide 100/25 and amlodipine 5mg  daily

## 2023-03-11 NOTE — Assessment & Plan Note (Signed)
Quiet on omeprazole Can try to skip some days

## 2023-03-11 NOTE — Progress Notes (Signed)
Subjective:    Patient ID: [name], female    DOB: Dec 27, 1970, 52 y.o.   MRN: [id number]  HPI Here for physical  Doing okay No new concerns Still has some word finding problems--may be worse No other cognitive issues Exercises regularly---uses albuterol before (hasn't needed duoneb in some time)  Menopausal symptoms controlled with venlafaxine Some breakthrough hot flashes though  Sporadically checks BP Has been better since amlodipine  Current Outpatient Medications on File Prior to Visit  Medication Sig Dispense Refill   albuterol (VENTOLIN HFA) 108 (90 Base) MCG/ACT inhaler Inhale 2 puffs into the lungs every 6 (six) hours as needed for wheezing or shortness of breath. 18 g 1   amLODipine (NORVASC) 5 MG tablet Take 1 tablet (5 mg total) by mouth daily. 90 tablet 3   aspirin 81 MG EC tablet      butalbital-acetaminophen-caffeine (FIORICET) 50-325-40 MG tablet Take 1 tablet by mouth daily as needed for headache. 30 tablet 0   fexofenadine (ALLEGRA) 180 MG tablet Take 180 mg by mouth daily.     Glycerin-Polysorbate 80 (REFRESH DRY EYE THERAPY OP) Apply to eye.     hydrochlorothiazide (HYDRODIURIL) 25 MG tablet TAKE ONE TABLET BY MOUTH EVERY DAY 90 tablet 3   ipratropium-albuterol (DUONEB) 0.5-2.5 (3) MG/3ML SOLN Take 3 mLs by nebulization every 6 (six) hours as needed. 120 mL 1   losartan (COZAAR) 100 MG tablet TAKE ONE TABLET BY MOUTH EVERY DAY 90 tablet 3   Melatonin 10 MG TABS Take by mouth.     meloxicam (MOBIC) 15 MG tablet TAKE ONE TABLET BY MOUTH EVERY DAY AS NEEDED FOR PAIN 90 tablet 3   Multiple Vitamin (MULTIVITAMIN) capsule Take 1 capsule by mouth daily.     NON FORMULARY Phytoestrogen 2 tablets daily     omeprazole (PRILOSEC) 20 MG capsule TAKE 1 CAPSULE BY MOUTH TWICE DAILY AS DIRECTED BEFORE A MEAL 180 capsule 3   potassium chloride SA (KLOR-CON M) 20 MEQ tablet TAKE ONE-HALF TABLET BY MOUTH EVERY DAY 90 tablet 3   venlafaxine XR (EFFEXOR-XR) 150 MG 24 hr  capsule TAKE 1 CAPSULE BY MOUTH ONCE DAILY WITH BREAKFAST 90 capsule 3   No current facility-administered medications on file prior to visit.    Allergies  Allergen Reactions   Codeine Sulfate     REACTION: Nausea    Past Medical History:  Diagnosis Date   Allergy    Breast fibroadenoma    Diverticulitis    GERD (gastroesophageal reflux disease)    Hypertension    Migraines    Migraines    Premenstrual dysphoric disorder     Past Surgical History:  Procedure Laterality Date   BACK SURGERY  3/07   c6-c7 fused    BREAST BIOPSY Left 2013   negative with clip   BREAST BIOPSY Right 08/11/2018   BX with Dr. Lemar Livings, ribbon marker, FIBROADENOMA WITH FOCAL USUAL DUCT EPITHELIAL HYPERPLASIA   BREAST EXCISIONAL BIOPSY Bilateral yrs ago   benign   CERVICAL FUSION     C6-7   COLONOSCOPY     DILATION AND CURETTAGE OF UTERUS  10/20/14   TONSILLECTOMY     Uterine ablation  5/16   Dr Unice Cobble   wisdom teeth removal   1996    Family History  Problem Relation Age of Onset   Colon polyps Mother    Atrial fibrillation Mother    Congestive Heart Failure Mother    Coronary artery disease Father    Hypertension  Father    Lung cancer Father    Breast cancer Sister 25   Colon cancer Maternal Grandmother    Coronary artery disease Paternal Grandmother    Colon cancer Paternal Grandfather    Colon cancer Maternal Uncle     Social History   Socioeconomic History   Marital status: Married    Spouse name: Not on file   Number of children: 2   Years of education: Not on file   Highest education level: Not on file  Occupational History   Occupation:  Hospice nurse   Occupation:    Tobacco Use   Smoking status: Never    Passive exposure: Past (as a child)   Smokeless tobacco: Never  Substance and Sexual Activity   Alcohol use: No    Alcohol/week: 0.0 standard drinks of alcohol   Drug use: No   Sexual activity: Not on file  Other Topics Concern   Not on file   Social History Narrative   Divorced 2012--- remarried 2015   Social Determinants of Health   Financial Resource Strain: Not on file  Food Insecurity: Not on file  Transportation Needs: Not on file  Physical Activity: Not on file  Stress: Not on file  Social Connections: Not on file  Intimate Partner Violence: Not on file   Review of Systems  Constitutional:  Negative for fatigue.       Weight down a few pounds Wears seat belt  HENT:  Positive for tinnitus. Negative for dental problem and hearing loss.        Keeps up with dentist  Eyes:  Negative for visual disturbance.       No diplopia or unilateral vision loss  Respiratory:  Negative for cough, chest tightness and shortness of breath.   Cardiovascular:  Negative for chest pain, palpitations and leg swelling.  Gastrointestinal:  Negative for abdominal pain, blood in stool and constipation.       No heartburn with omeprazole  Genitourinary:  Negative for dyspareunia, dysuria and hematuria.  Musculoskeletal:  Negative for back pain and joint swelling.       Thumbs are still bad---topical voltaren/daily meloxicam  Skin:  Negative for rash.       Does get routine derm evaluations  Allergic/Immunologic: Positive for environmental allergies. Negative for immunocompromised state.       Satisfied with allegra  Neurological:  Negative for dizziness, syncope and light-headedness.       Headaches not as common  Hematological:  Negative for adenopathy. Does not bruise/bleed easily.  Psychiatric/Behavioral:  Negative for dysphoric mood and sleep disturbance. The patient is not nervous/anxious.        Objective:   Physical Exam Constitutional:      Appearance: Normal appearance.  HENT:     Mouth/Throat:     Pharynx: No oropharyngeal exudate or posterior oropharyngeal erythema.  Eyes:     Conjunctiva/sclera: Conjunctivae normal.     Pupils: Pupils are equal, round, and reactive to light.  Cardiovascular:     Rate and Rhythm:  Normal rate and regular rhythm.     Pulses: Normal pulses.     Heart sounds: No murmur heard.    No gallop.  Pulmonary:     Effort: Pulmonary effort is normal.     Breath sounds: Normal breath sounds. No wheezing or rales.  Abdominal:     Palpations: Abdomen is soft.     Tenderness: There is no abdominal tenderness.  Musculoskeletal:     Cervical back: Neck  supple.     Right lower leg: No edema.     Left lower leg: No edema.  Lymphadenopathy:     Cervical: No cervical adenopathy.  Skin:    Findings: No rash.  Neurological:     General: No focal deficit present.     Mental Status: She is alert and oriented to person, place, and time.  Psychiatric:        Mood and Affect: Mood normal.        Behavior: Behavior normal.            Assessment & Plan:

## 2023-03-11 NOTE — Assessment & Plan Note (Signed)
Healthy Regular exercise Colon due 2031 Pap due 2027 Yearly mammograms---just had Update COVID/flu vaccines this fall

## 2023-03-31 ENCOUNTER — Other Ambulatory Visit: Payer: Self-pay | Admitting: Internal Medicine

## 2023-04-23 DIAGNOSIS — B081 Molluscum contagiosum: Secondary | ICD-10-CM | POA: Diagnosis not present

## 2023-04-23 DIAGNOSIS — D2262 Melanocytic nevi of left upper limb, including shoulder: Secondary | ICD-10-CM | POA: Diagnosis not present

## 2023-04-23 DIAGNOSIS — R238 Other skin changes: Secondary | ICD-10-CM | POA: Diagnosis not present

## 2023-04-23 DIAGNOSIS — D2261 Melanocytic nevi of right upper limb, including shoulder: Secondary | ICD-10-CM | POA: Diagnosis not present

## 2023-04-23 DIAGNOSIS — Z872 Personal history of diseases of the skin and subcutaneous tissue: Secondary | ICD-10-CM | POA: Diagnosis not present

## 2023-04-23 DIAGNOSIS — R208 Other disturbances of skin sensation: Secondary | ICD-10-CM | POA: Diagnosis not present

## 2023-04-23 DIAGNOSIS — Z85828 Personal history of other malignant neoplasm of skin: Secondary | ICD-10-CM | POA: Diagnosis not present

## 2023-07-27 ENCOUNTER — Encounter: Payer: Self-pay | Admitting: Internal Medicine

## 2023-07-28 MED ORDER — ALBUTEROL SULFATE HFA 108 (90 BASE) MCG/ACT IN AERS: 2.0000 | INHALATION_SPRAY | Freq: Four times a day (QID) | RESPIRATORY_TRACT | 1 refills | Status: AC | PRN

## 2023-07-28 MED ORDER — POTASSIUM CHLORIDE CRYS ER 20 MEQ PO TBCR: 10.0000 meq | EXTENDED_RELEASE_TABLET | Freq: Every day | ORAL | 3 refills | Status: AC

## 2023-09-03 ENCOUNTER — Encounter: Payer: Self-pay | Admitting: Internal Medicine

## 2023-09-03 MED ORDER — AMLODIPINE BESYLATE 5 MG PO TABS: 5.0000 mg | ORAL_TABLET | Freq: Every day | ORAL | 3 refills | Status: DC

## 2023-11-26 ENCOUNTER — Other Ambulatory Visit: Payer: Self-pay

## 2023-11-26 DIAGNOSIS — Z1231 Encounter for screening mammogram for malignant neoplasm of breast: Secondary | ICD-10-CM

## 2023-12-06 ENCOUNTER — Encounter: Payer: Self-pay | Admitting: Internal Medicine

## 2023-12-12 DIAGNOSIS — M545 Low back pain, unspecified: Secondary | ICD-10-CM | POA: Diagnosis not present

## 2023-12-17 ENCOUNTER — Ambulatory Visit: Admitting: Internal Medicine

## 2023-12-17 ENCOUNTER — Encounter: Payer: Self-pay | Admitting: Internal Medicine

## 2023-12-17 VITALS — BP 122/88 | HR 85 | Temp 99.0°F | Ht 63.5 in | Wt 168.0 lb

## 2023-12-17 DIAGNOSIS — N898 Other specified noninflammatory disorders of vagina: Secondary | ICD-10-CM | POA: Insufficient documentation

## 2023-12-17 MED ORDER — METRONIDAZOLE 0.75 % VA GEL: 1.0000 | Freq: Two times a day (BID) | VAGINAL | 1 refills | Status: DC

## 2023-12-17 NOTE — Addendum Note (Signed)
 Addended by: Franne Ivory on: 12/17/2023 12:28 PM   Modules accepted: Orders

## 2023-12-17 NOTE — Assessment & Plan Note (Signed)
 Not much discharge Similar to past BV spells Wet prep sent Will try empiric metronidazole  gel Husband may need Rx if recurs

## 2023-12-17 NOTE — Progress Notes (Signed)
 Subjective:    Patient ID: [name], female    DOB: 1971/01/24, 53 y.o.   MRN: [id number]  HPI Here for vaginal symptoms---like past bacterial vaginosis  Has had 2 prior episodes of bacterial vaginosis Metronidazole  has helped in the past Mostly just has itching Not much discharge OTC yeast stuff doesn't help  Has had some pain with sex Monogamous with husband  Current Outpatient Medications on File Prior to Visit  Medication Sig Dispense Refill   albuterol  (VENTOLIN  HFA) 108 (90 Base) MCG/ACT inhaler Inhale 2 puffs into the lungs every 6 (six) hours as needed for wheezing or shortness of breath. 18 g 1   amLODipine  (NORVASC ) 5 MG tablet Take 1 tablet (5 mg total) by mouth daily. 90 tablet 3   aspirin 81 MG EC tablet      butalbital -acetaminophen-caffeine  (FIORICET) 50-325-40 MG tablet Take 1 tablet by mouth daily as needed for headache. 30 tablet 0   fexofenadine (ALLEGRA) 180 MG tablet Take 180 mg by mouth daily.     Glycerin-Polysorbate 80 (REFRESH DRY EYE THERAPY OP) Apply to eye.     ipratropium-albuterol  (DUONEB) 0.5-2.5 (3) MG/3ML SOLN Take 3 mLs by nebulization every 6 (six) hours as needed. 120 mL 1   losartan -hydrochlorothiazide (HYZAAR) 100-25 MG tablet Take 1 tablet by mouth daily. 90 tablet 3   Melatonin 10 MG TABS Take by mouth.     meloxicam  (MOBIC ) 15 MG tablet TAKE ONE TABLET BY MOUTH EVERY DAY AS NEEDED FOR PAIN 90 tablet 3   Multiple Vitamin (MULTIVITAMIN) capsule Take 1 capsule by mouth daily.     NON FORMULARY Phytoestrogen 2 tablets daily     omeprazole  (PRILOSEC) 20 MG capsule TAKE 1 CAPSULE BY MOUTH TWICE DAILY AS DIRECTED BEFORE A MEAL 180 capsule 3   potassium chloride  SA (KLOR-CON  M) 20 MEQ tablet Take 0.5 tablets (10 mEq total) by mouth daily. 90 tablet 3   venlafaxine  XR (EFFEXOR -XR) 150 MG 24 hr capsule TAKE 1 CAPSULE BY MOUTH ONCE DAILY WITH BREAKFAST 90 capsule 3   No current facility-administered medications on file prior to visit.     Allergies  Allergen Reactions   Codeine Sulfate     REACTION: Nausea    Past Medical History:  Diagnosis Date   Allergy    Breast fibroadenoma    Diverticulitis    GERD (gastroesophageal reflux disease)    Hypertension    Migraines    Migraines    Premenstrual dysphoric disorder     Past Surgical History:  Procedure Laterality Date   BACK SURGERY  3/07   c6-c7 fused    BREAST BIOPSY Left 2013   negative with clip   BREAST BIOPSY Right 08/11/2018   BX with Dr. Marquita Situ, ribbon marker, FIBROADENOMA WITH FOCAL USUAL DUCT EPITHELIAL HYPERPLASIA   BREAST EXCISIONAL BIOPSY Bilateral yrs ago   benign   CERVICAL FUSION     C6-7   COLONOSCOPY     DILATION AND CURETTAGE OF UTERUS  10/20/14   TONSILLECTOMY     Uterine ablation  5/16   Dr Benna Brasher   wisdom teeth removal   1996    Family History  Problem Relation Age of Onset   Colon polyps Mother    Atrial fibrillation Mother    Congestive Heart Failure Mother    Coronary artery disease Father    Hypertension Father    Lung cancer Father    Breast cancer Sister 69   Colon cancer Maternal Grandmother  Coronary artery disease Paternal Grandmother    Colon cancer Paternal Grandfather    Colon cancer Maternal Uncle     Social History   Socioeconomic History   Marital status: Married    Spouse name: Not on file   Number of children: 2   Years of education: Not on file   Highest education level: Not on file  Occupational History   Occupation:  Hospice nurse   Occupation:    Tobacco Use   Smoking status: Never    Passive exposure: Past (as a child)   Smokeless tobacco: Never  Substance and Sexual Activity   Alcohol use: No    Alcohol/week: 0.0 standard drinks of alcohol   Drug use: No   Sexual activity: Not on file  Other Topics Concern   Not on file  Social History Narrative   Divorced 2012--- remarried 2015   Social Drivers of Corporate investment banker Strain: Not on file  Food  Insecurity: Not on file  Transportation Needs: Not on file  Physical Activity: Not on file  Stress: Not on file  Social Connections: Not on file  Intimate Partner Violence: Not on file   Review of Systems     Objective:   Physical Exam         Assessment & Plan:

## 2023-12-18 ENCOUNTER — Encounter: Payer: Self-pay | Admitting: Internal Medicine

## 2023-12-18 LAB — WET PREP BY MOLECULAR PROBE
Candida species: NOT DETECTED
MICRO NUMBER:: 16365234
SPECIMEN QUALITY:: ADEQUATE
Trichomonas vaginosis: NOT DETECTED

## 2023-12-26 ENCOUNTER — Encounter

## 2023-12-31 ENCOUNTER — Ambulatory Visit
Admission: RE | Admit: 2023-12-31 | Discharge: 2023-12-31 | Disposition: A | Discharge status: Home / Self Care | Source: Ambulatory Visit | Attending: Surgery | Admitting: Surgery

## 2023-12-31 DIAGNOSIS — Z1231 Encounter for screening mammogram for malignant neoplasm of breast: Secondary | ICD-10-CM | POA: Diagnosis not present

## 2024-01-07 ENCOUNTER — Encounter: Payer: Self-pay | Admitting: Surgery

## 2024-01-07 ENCOUNTER — Other Ambulatory Visit: Payer: Self-pay | Admitting: Internal Medicine

## 2024-01-07 ENCOUNTER — Ambulatory Visit: Admitting: Surgery

## 2024-01-07 VITALS — BP 109/72 | HR 91 | Ht 63.5 in | Wt 167.6 lb

## 2024-01-07 DIAGNOSIS — Z1231 Encounter for screening mammogram for malignant neoplasm of breast: Secondary | ICD-10-CM

## 2024-01-07 NOTE — Progress Notes (Signed)
 Outpatient Surgical Follow Up  01/07/2024  [name] is a 53 y.o. female.   Chief Complaint  Patient presents with   Follow-up    HPI: 53 year old female with a history of fibroadenoma on the right breast that was biopsied by Dr.Byrnett in 2019.  She now comes for yearly physical exam.  I have personally reviewed her mammogram showing no evidence of any suspicious lesions.  She denies any breast lesions or breast complains.  No fevers no chills no breast discharge.  No weight loss. She reports asymmetry left being smaller than right breast. 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 Lorel Roes in 2013. She Does have significant history of colorectal cancer in her family.  Her colonoscopy was nml. SHe does have 2 sisters with diagnosis of breast cancer. SHe has no complaints today.  No changes in health history.  Still very active. 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 Marquita Situ, ribbon marker, FIBROADENOMA WITH FOCAL USUAL DUCT EPITHELIAL HYPERPLASIA   BREAST EXCISIONAL BIOPSY Bilateral yrs ago   benign   CERVICAL FUSION     C6-7   COLONOSCOPY     DILATION AND CURETTAGE OF UTERUS  10/20/14   TONSILLECTOMY     Uterine ablation  5/16   Dr Benna Brasher   wisdom teeth removal   1996    Family History  Problem Relation Age of Onset   Colon polyps Mother    Atrial fibrillation Mother    Congestive Heart Failure Mother    Coronary artery disease Father    Hypertension Father    Lung cancer Father    Breast cancer Sister 56    Colon cancer Maternal Grandmother    Coronary artery disease Paternal Grandmother    Colon cancer Paternal Grandfather    Colon cancer Maternal Uncle     Social History:  reports that she has never smoked. She has been exposed to tobacco smoke. She has never used smokeless tobacco. She reports that she does not drink alcohol and does not use drugs.  Allergies:  Allergies  Allergen Reactions   Codeine Sulfate     REACTION: Nausea    Medications reviewed.    ROS Full ROS performed and is otherwise negative other than what is stated in HPI   BP 109/72   Pulse 91   Ht 5' 3.5" (1.613 m)   Wt 167 lb 9.6 oz (76 kg)   SpO2 96%   BMI 29.22 kg/m   Physical Exam  Physical Exam Vitals and nursing note reviewed. Exam conducted with a chaperone present.  Constitutional:      General: She is not in acute distress.    Appearance: Normal appearance. She is normal weight.  Cardiovascular:     Rate and Rhythm: Normal rate and regular rhythm.     Heart sounds: No murmur heard. No friction rub.  Pulmonary:     Effort: Pulmonary effort is normal.  No respiratory distress.     Breath sounds: Normal breath sounds. No stridor. No wheezing.     Comments: BREAST: No evidence of palpable lesions, prior lumpectomy scars bilaterally. No LAD., slight breast asymmetry w/o being pathologic Abdominal:     General: Abdomen is flat. There is no distension.     Palpations: Abdomen is soft. There is no mass.     Tenderness: There is no abdominal tenderness. There is no guarding or rebound.     Hernia: No hernia is present.  Musculoskeletal:        General: No swelling or tenderness. Normal range of motion.     Cervical back: Normal range of motion and neck supple. No rigidity or tenderness.  Lymphadenopathy:     Cervical: No cervical adenopathy.  Skin:    General: Skin is warm and dry.     Capillary Refill: Capillary refill takes less than 2 seconds.  Neurological:     General: No focal deficit  present.     Mental Status: She is alert and oriented to person, place, and time.  Psychiatric:        Mood and Affect: Mood normal.        Behavior: Behavior normal.        Thought Content: Thought content normal.        Judgment: Judgment normal.    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  There is no evidence of any new concerning lesions on physical exam nor imaging studies.  We will continue yearly exams with mammograms.  No need for any surgical intervention or further w/u at this time. D/W pt about slight asymmetry being cause by hormonal changes related to menopause      Evelia Hipp, MD [facility] General Surgeon

## 2024-01-07 NOTE — Patient Instructions (Signed)
 Patient will be asked to return to the office in one year with a bilateral screening mammogram.  We will send you a letter about these appointments.    Continue self breast exams. Call office for any new breast issues or concerns.   Breast Self-Awareness Breast self-awareness is knowing how your breasts look and feel. You need to: Check your breasts on a regular basis. Tell your doctor about any changes. Become familiar with the look and feel of your breasts. This can help you catch a breast problem while it is still small and can be treated. You should do breast self-exams even if you have breast implants. What you need: A mirror. A well-lit room. A pillow or other soft object. How to do a breast self-exam Follow these steps to do a breast self-exam: Look for changes  Take off all the clothes above your waist. Stand in front of a mirror in a room with good lighting. Put your hands down at your sides. Compare your breasts in the mirror. Look for any difference between them, such as: A difference in shape. A difference in size. Wrinkles, dips, and bumps in one breast and not the other. Look at each breast for changes in the skin, such as: Redness. Scaly areas. Skin that has gotten thicker. Dimpling. Open sores (ulcers). Look for changes in your nipples, such as: Fluid coming out of a nipple. Fluid around a nipple. Bleeding. Dimpling. Redness. A nipple that looks pushed in (retracted), or that has changed position. Feel for changes Lie on your back. Feel each breast. To do this: Pick a breast to feel. Place a pillow under the shoulder closest to that breast. Put the arm closest to that breast behind your head. Feel the nipple area of that breast using the hand of your other arm. Feel the area with the pads of your three middle fingers by making small circles with your fingers. Use light, medium, and firm pressure. Continue the overlapping circles, moving downward over the  breast. Keep making circles with your fingers. Stop when you feel your ribs. Start making circles with your fingers again, this time going upward until you reach your collarbone. Then, make circles outward across your breast and into your armpit area. Squeeze your nipple. Check for discharge and lumps. Repeat these steps to check your other breast. Sit or stand in the tub or shower. With soapy water on your skin, feel each breast the same way you did when you were lying down. Write down what you find Writing down what you find can help you remember what to tell your doctor. Write down: What is normal for each breast. Any changes you find in each breast. These include: The kind of changes you find. A tender or painful breast. Any lump you find. Write down its size and where it is. When you last had your monthly period (menstrual cycle). General tips If you are breastfeeding, the best time to check your breasts is after you feed your baby or after you use a breast pump. If you get monthly bleeding, the best time to check your breasts is 5-7 days after your monthly cycle ends. With time, you will become comfortable with the self-exam. You will also start to know if there are changes in your breasts. Contact a doctor if: You see a change in the shape or size of your breasts or nipples. You see a change in the skin of your breast or nipples, such as red or scaly skin.  You have fluid coming from your nipples that is not normal. You find a new lump or thick area. You have breast pain. You have any concerns about your breast health. Summary Breast self-awareness includes looking for changes in your breasts and feeling for changes within your breasts. You should do breast self-awareness in front of a mirror in a well-lit room. If you get monthly periods (menstrual cycles), the best time to check your breasts is 5-7 days after your period ends. Tell your doctor about any changes you see in your  breasts. Changes include changes in size, changes on the skin, painful or tender breasts, or fluid from your nipples that is not normal. This information is not intended to replace advice given to you by your health care provider. Make sure you discuss any questions you have with your health care provider. Document Revised: 01/17/2022 Document Reviewed: 06/14/2021 Elsevier Patient Education  2024 [employer].

## 2024-02-08 ENCOUNTER — Other Ambulatory Visit: Payer: Self-pay | Admitting: Internal Medicine

## 2024-03-10 ENCOUNTER — Other Ambulatory Visit: Payer: Self-pay | Admitting: Internal Medicine

## 2024-03-11 ENCOUNTER — Encounter: Payer: BC Managed Care – PPO | Admitting: Internal Medicine

## 2024-03-23 ENCOUNTER — Ambulatory Visit (INDEPENDENT_AMBULATORY_CARE_PROVIDER_SITE_OTHER): Admitting: Internal Medicine

## 2024-03-23 ENCOUNTER — Encounter: Payer: Self-pay | Admitting: Internal Medicine

## 2024-03-23 VITALS — BP 104/80 | HR 83 | Temp 98.8°F | Ht 63.5 in | Wt 168.0 lb

## 2024-03-23 DIAGNOSIS — H02403 Unspecified ptosis of bilateral eyelids: Secondary | ICD-10-CM | POA: Insufficient documentation

## 2024-03-23 DIAGNOSIS — Z23 Encounter for immunization: Secondary | ICD-10-CM

## 2024-03-23 DIAGNOSIS — N951 Menopausal and female climacteric states: Secondary | ICD-10-CM

## 2024-03-23 DIAGNOSIS — Z0000 Encounter for general adult medical examination without abnormal findings: Secondary | ICD-10-CM

## 2024-03-23 DIAGNOSIS — J4520 Mild intermittent asthma, uncomplicated: Secondary | ICD-10-CM

## 2024-03-23 DIAGNOSIS — I10 Essential (primary) hypertension: Secondary | ICD-10-CM

## 2024-03-23 LAB — COMPREHENSIVE METABOLIC PANEL WITH GFR
ALT: 34 U/L (ref 0–35)
AST: 23 U/L (ref 0–37)
Albumin: 4.4 g/dL (ref 3.5–5.2)
Alkaline Phosphatase: 101 U/L (ref 39–117)
BUN: 13 mg/dL (ref 6–23)
CO2: 29 meq/L (ref 19–32)
Calcium: 9.3 mg/dL (ref 8.4–10.5)
Chloride: 101 meq/L (ref 96–112)
Creatinine, Ser: 0.81 mg/dL (ref 0.40–1.20)
GFR: 81.98 mL/min (ref >60.00)
Glucose, Bld: 82 mg/dL (ref 70–99)
Potassium: 4 meq/L (ref 3.5–5.1)
Sodium: 141 meq/L (ref 135–145)
Total Bilirubin: 0.5 mg/dL (ref 0.2–1.2)
Total Protein: 6.9 g/dL (ref 6.0–8.3)

## 2024-03-23 LAB — LIPID PANEL
Cholesterol: 211 mg/dL — ABNORMAL HIGH (ref 0–200)
HDL: 60.4 mg/dL (ref >39.00)
LDL Cholesterol: 123 mg/dL — ABNORMAL HIGH (ref 0–99)
NonHDL: 150.34
Total CHOL/HDL Ratio: 3
Triglycerides: 138 mg/dL (ref 0.0–149.0)
VLDL: 27.6 mg/dL (ref 0.0–40.0)

## 2024-03-23 LAB — CBC
HCT: 39.9 % (ref 36.0–46.0)
Hemoglobin: 13.4 g/dL (ref 12.0–15.0)
MCHC: 33.7 g/dL (ref 30.0–36.0)
MCV: 82.8 fl (ref 78.0–100.0)
Platelets: 357 K/uL (ref 150.0–400.0)
RBC: 4.81 Mil/uL (ref 3.87–5.11)
RDW: 13.8 % (ref 11.5–15.5)
WBC: 5.5 K/uL (ref 4.0–10.5)

## 2024-03-23 LAB — VITAMIN B12: Vitamin B-12: 583 pg/mL (ref 211–911)

## 2024-03-23 LAB — TSH: TSH: 0.99 u[IU]/mL (ref 0.35–5.50)

## 2024-03-23 MED ORDER — IPRATROPIUM-ALBUTEROL 0.5-2.5 (3) MG/3ML IN SOLN: 3.0000 mL | Freq: Four times a day (QID) | RESPIRATORY_TRACT | 1 refills | Status: AC | PRN

## 2024-03-23 MED ORDER — BUTALBITAL-APAP-CAFFEINE 50-325-40 MG PO TABS: 1.0000 | ORAL_TABLET | Freq: Every day | ORAL | 0 refills | Status: AC | PRN

## 2024-03-23 NOTE — Assessment & Plan Note (Signed)
 Not overly concerning for MG--but will check antibodies just in case Consider neurology evaluation--and should get eyes checked

## 2024-03-23 NOTE — Assessment & Plan Note (Signed)
 Mostly controlled with the venlafaxine  Would consider wean once she has no symptoms at all

## 2024-03-23 NOTE — Assessment & Plan Note (Signed)
 Uses albuterol  HFA before exercise Rare nebs

## 2024-03-23 NOTE — Assessment & Plan Note (Signed)
 BP Readings from Last 3 Encounters:  03/23/24 104/80  01/07/24 109/72  12/17/23 122/88   Controlled on losartan /hydrochlorothiazide 100/25

## 2024-03-23 NOTE — Addendum Note (Signed)
 Addended by: KALLIE CLOTILDA SQUIBB on: 03/23/2024 10:50 AM   Modules accepted: Orders

## 2024-03-23 NOTE — Assessment & Plan Note (Addendum)
 Healthy Does exercise Colon due 20321 Pap due 2027 Yearly mammograms Prefers no flu/COVID vaccines

## 2024-03-23 NOTE — Progress Notes (Signed)
 Subjective:    Patient ID: [name], female    DOB: 1971-01-14, 53 y.o.   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 18 g 1   amLODipine  (NORVASC ) 5 MG tablet Take 1 tablet (5 mg total) by mouth daily. 90 tablet 3   aspirin 81 MG EC tablet      butalbital -acetaminophen-caffeine  (FIORICET) 50-325-40 MG tablet Take 1 tablet by mouth daily as needed for headache. 30 tablet 0   fexofenadine (ALLEGRA) 180 MG tablet Take 180 mg by mouth daily.     Glycerin-Polysorbate 80 (REFRESH DRY EYE THERAPY OP) Apply to eye.     ipratropium-albuterol  (DUONEB) 0.5-2.5 (3) MG/3ML SOLN Take 3 mLs by nebulization every 6 (six) hours as needed. 120 mL 1   losartan -hydrochlorothiazide (HYZAAR) 100-25 MG tablet Take 1 tablet by mouth once daily. 90 tablet 3   Melatonin 10 MG TABS Take by mouth.     meloxicam  (MOBIC ) 15 MG tablet Take 1 tablet by mouth daily as needed for pain. 90 tablet 1   Multiple Vitamin (MULTIVITAMIN) capsule Take 1 capsule by mouth daily.     omeprazole  (PRILOSEC) 20 MG capsule TAKE 1 CAPSULE BY MOUTH TWICE DAILY AS DIRECTED BEFORE A MEAL 180 capsule 3   potassium chloride  SA (KLOR-CON  M) 20 MEQ tablet Take 0.5 tablets (10 mEq total) by mouth daily. 90 tablet 3   venlafaxine  XR (EFFEXOR -XR) 150 MG 24 hr capsule  Take 1 capsule by mouth once daily with breakfast. 90 capsule 3   No current facility-administered medications on file prior to visit.    Allergies  Allergen Reactions   Codeine Sulfate     REACTION: Nausea    Past Medical History:  Diagnosis Date   Allergy    Breast fibroadenoma    Diverticulitis    GERD (gastroesophageal reflux disease)    Hypertension    Migraines    Migraines    Premenstrual dysphoric disorder     Past Surgical History:  Procedure Laterality Date   BACK SURGERY  3/07   c6-c7 fused    BREAST BIOPSY Left 2013   negative with clip   BREAST BIOPSY Right 08/11/2018   BX with Dr. Dessa, ribbon marker, FIBROADENOMA WITH FOCAL USUAL DUCT EPITHELIAL HYPERPLASIA   BREAST EXCISIONAL BIOPSY Bilateral yrs ago   benign   CERVICAL FUSION     C6-7   COLONOSCOPY     DILATION AND CURETTAGE OF UTERUS  10/20/14   TONSILLECTOMY     Uterine ablation  5/16   Dr Mearl   wisdom teeth removal   1996    Family History  Problem Relation Age of Onset   Colon polyps Mother    Atrial fibrillation Mother    Congestive Heart Failure Mother    Coronary artery disease Father  Hypertension Father    Lung cancer Father    Breast cancer Sister 61   Colon cancer Maternal Grandmother    Coronary artery disease Paternal Grandmother    Colon cancer Paternal Grandfather    Colon cancer Maternal Uncle     Social History   Socioeconomic History   Marital status: Married    Spouse name: Not on file   Number of children: 2   Years of education: Not on file   Highest education level: Not on file  Occupational History   Occupation:  Hospice nurse   Occupation:    Tobacco Use   Smoking status: Never    Passive exposure: Past (as a child)   Smokeless tobacco: Never  Substance and Sexual Activity   Alcohol use: No    Alcohol/week: 0.0 standard drinks of alcohol   Drug use: No   Sexual activity: Not on file  Other Topics Concern   Not on file  Social  History Narrative   Divorced 2012--- remarried 2015   Social Drivers of Corporate investment banker Strain: Not on file  Food Insecurity: Not on file  Transportation Needs: Not on file  Physical Activity: Not on file  Stress: Not on file  Social Connections: Not on file  Intimate Partner Violence: Not on file   Review of Systems  Constitutional:  Negative for fatigue and unexpected weight change.       Wears seat belt  HENT:  Negative for dental problem, hearing loss and tinnitus.        Keeps up with dentist  Eyes:  Negative for visual disturbance.       No unilateral vision loss  Respiratory:  Negative for cough, chest tightness and shortness of breath.   Cardiovascular:  Negative for chest pain, palpitations and leg swelling.  Gastrointestinal:  Negative for blood in stool and constipation.  Endocrine: Negative for polydipsia and polyuria.  Genitourinary:  Positive for dyspareunia. Negative for dysuria and hematuria.       Does use lubricant  Musculoskeletal:  Positive for back pain. Negative for joint swelling.       CMC joint arthritis--uses meloxicam   daily and topical diclofenac   Skin:  Negative for rash.       Does see derm  Allergic/Immunologic: Positive for environmental allergies. Negative for immunocompromised state.  Neurological:  Negative for dizziness, syncope and light-headedness.       Headaches better  Hematological:  Negative for adenopathy. Does not bruise/bleed easily.  Psychiatric/Behavioral:  Negative for dysphoric mood and sleep disturbance. The patient is not nervous/anxious.        Objective:   Physical Exam Constitutional:      Appearance: Normal appearance.  HENT:     Mouth/Throat:     Pharynx: No oropharyngeal exudate or posterior oropharyngeal erythema.  Eyes:     Conjunctiva/sclera: Conjunctivae normal.     Pupils: Pupils are equal, round, and reactive to light.  Cardiovascular:     Rate and Rhythm: Normal rate and regular rhythm.      Pulses: Normal pulses.     Heart sounds: No murmur heard.    No gallop.  Pulmonary:     Effort: Pulmonary effort is normal.     Breath sounds: Normal breath sounds. No wheezing or rales.  Abdominal:     Palpations: Abdomen is soft.     Tenderness: There is no abdominal tenderness.  Musculoskeletal:     Cervical back: Neck supple.     Right  lower leg: No edema.     Left lower leg: No edema.  Lymphadenopathy:     Cervical: No cervical adenopathy.  Skin:    Findings: No rash.  Neurological:     General: No focal deficit present.     Mental Status: She is alert and oriented to person, place, and time.  Psychiatric:        Mood and Affect: Mood normal.        Behavior: Behavior normal.            Assessment & Plan:

## 2024-03-24 ENCOUNTER — Ambulatory Visit: Payer: Self-pay | Admitting: Internal Medicine

## 2024-03-24 ENCOUNTER — Encounter: Payer: Self-pay | Admitting: Internal Medicine

## 2024-03-24 MED ORDER — PANTOPRAZOLE SODIUM 40 MG PO TBEC: 40.0000 mg | DELAYED_RELEASE_TABLET | Freq: Every day | ORAL | 3 refills | Status: AC

## 2024-04-28 DIAGNOSIS — B081 Molluscum contagiosum: Secondary | ICD-10-CM | POA: Diagnosis not present

## 2024-04-28 DIAGNOSIS — R238 Other skin changes: Secondary | ICD-10-CM | POA: Diagnosis not present

## 2024-04-28 DIAGNOSIS — D2262 Melanocytic nevi of left upper limb, including shoulder: Secondary | ICD-10-CM | POA: Diagnosis not present

## 2024-04-28 DIAGNOSIS — D2261 Melanocytic nevi of right upper limb, including shoulder: Secondary | ICD-10-CM | POA: Diagnosis not present

## 2024-04-28 DIAGNOSIS — D225 Melanocytic nevi of trunk: Secondary | ICD-10-CM | POA: Diagnosis not present

## 2024-04-28 DIAGNOSIS — L820 Inflamed seborrheic keratosis: Secondary | ICD-10-CM | POA: Diagnosis not present

## 2024-04-28 DIAGNOSIS — L538 Other specified erythematous conditions: Secondary | ICD-10-CM | POA: Diagnosis not present

## 2024-04-28 DIAGNOSIS — D2272 Melanocytic nevi of left lower limb, including hip: Secondary | ICD-10-CM | POA: Diagnosis not present

## 2024-05-24 ENCOUNTER — Telehealth: Payer: Self-pay | Admitting: Internal Medicine

## 2024-05-24 NOTE — Telephone Encounter (Signed)
 Copied from CRM #[id number]. Topic: Clinical - Lab/Test Results >> May 24, 2024  1:53 PM Franky GRADE wrote: Reason for CRM: Patient is calling regarding a test result she never received for Myasthenia Gravis, she exchanged messages with Dr.Letvak back on August 28th where he stated he would have the lab follow up but no one has gotten back to her. She understands he retired but would like to know what exactly happened and why she never received an update.

## 2024-05-24 NOTE — Telephone Encounter (Signed)
 Left message on VM per DPR. I forgot that Dr Jimmy had verbally told me on his last day he was advised that the wrong test was ordered and it would not be done. He said that if she wanted to have it done, she would need to set up a transfer of care visit with a provider. I apologized for forgetting to get with her until she just called about it.

## 2024-07-19 ENCOUNTER — Ambulatory Visit

## 2024-07-19 VITALS — BP 130/84 | HR 92 | Temp 98.5°F | Ht 63.5 in | Wt 174.0 lb

## 2024-07-19 DIAGNOSIS — H02403 Unspecified ptosis of bilateral eyelids: Secondary | ICD-10-CM

## 2024-07-19 DIAGNOSIS — Z131 Encounter for screening for diabetes mellitus: Secondary | ICD-10-CM | POA: Diagnosis not present

## 2024-07-19 DIAGNOSIS — Z9189 Other specified personal risk factors, not elsewhere classified: Secondary | ICD-10-CM | POA: Diagnosis not present

## 2024-07-19 DIAGNOSIS — M19042 Primary osteoarthritis, left hand: Secondary | ICD-10-CM

## 2024-07-19 DIAGNOSIS — M19041 Primary osteoarthritis, right hand: Secondary | ICD-10-CM | POA: Diagnosis not present

## 2024-07-19 DIAGNOSIS — F339 Major depressive disorder, recurrent, unspecified: Secondary | ICD-10-CM | POA: Insufficient documentation

## 2024-07-19 DIAGNOSIS — I10 Essential (primary) hypertension: Secondary | ICD-10-CM

## 2024-07-19 LAB — HEMOGLOBIN A1C: Hgb A1c MFr Bld: 5.8 % (ref 4.6–6.5)

## 2024-07-19 MED ORDER — AMLODIPINE BESYLATE 5 MG PO TABS: 5.0000 mg | ORAL_TABLET | Freq: Every day | ORAL | 3 refills | Status: AC

## 2024-07-19 NOTE — Progress Notes (Signed)
 Subjective:   This visit was conducted in person. The patient gave informed consent to the use of Abridge AI technology to record the contents of the encounter as documented below.   Patient ID: [name], female    DOB: Feb 08, 1971, 53 y.o.   MRN: [id number]   Discussed the use of AI scribe software for clinical note transcription with the patient, who gave verbal consent to proceed.  History of Present Illness [name] is a 53 year old female who presents with eyelid drooping and muscle weakness.  She experiences eyelid drooping, described as feeling extremely heavy, necessitating closure of the eyes. This occurs randomly throughout the day, approximately three to four times daily, and is not necessarily worse at the end of the day. The drooping is less frequent in the evenings when she is more active. The episodes are more pronounced when she is still, such as when working on the computer or driving. She finds that getting up and moving around alleviates the heaviness. This issue has persisted for about five to six months without significant progression. No weakness in her arms or legs, and she does not experience difficulty getting up from a chair or climbing stairs. There is no associated shortness of breath, weight loss, or chest pain.  For menopausal symptoms, she is taking Effexor  150 mg daily, which has helped with hot flashes and mood stabilization. She also uses KY Jelly to alleviate painful intercourse, which she attributes to menopausal dryness.  Her arthritis primarily affects her hands, and she uses Mobic  (meloxicam ) 15 mg daily, Tylenol Arthritis (650 mg extended release) one in the morning and one at night, and Voltaren gel for pain management. Voltaren gel is particularly effective.  She takes potassium supplements due to low potassium levels attributed to hydrochlorothiazide use, and her levels have been stable since starting the supplement. She also takes Protonix  40 mg  daily for gastrointestinal symptoms, which are better controlled compared to when she was on omeprazole .  Her past medical history includes exercise-induced wheezing for which she uses albuterol  as needed, and she has a history of anxiety attacks during a past divorce but none recently. She takes Hyzaar and amlodipine  for blood pressure management, melatonin for sleep, Allegra for allergies, and occasionally Fioricet for headaches, which have decreased since menopause. She also takes a multivitamin and was taking aspirin daily for primary prevention but has decided to stop. She has no history of diabetes or smoking.   Review of Systems  All other systems reviewed and are negative.       Allergies  Allergen Reactions   Codeine Sulfate     REACTION: Nausea    Current Outpatient Medications on File Prior to Visit  Medication Sig Dispense Refill   albuterol  (VENTOLIN  HFA) 108 (90 Base) MCG/ACT inhaler Inhale 2 puffs into the lungs every 6 (six) hours as needed for wheezing or shortness of breath. 18 g 1   butalbital -acetaminophen-caffeine  (FIORICET) 50-325-40 MG tablet Take 1 tablet by mouth daily as needed for headache. 30 tablet 0   diclofenac Sodium (VOLTAREN) 1 % GEL Apply 4 g topically 4 (four) times daily.     fexofenadine (ALLEGRA) 180 MG tablet Take 180 mg by mouth daily.     Glycerin-Polysorbate 80 (REFRESH DRY EYE THERAPY OP) Apply to eye.     ipratropium-albuterol  (DUONEB) 0.5-2.5 (3) MG/3ML SOLN Take 3 mLs by nebulization every 6 (six) hours as needed. 120 mL 1   losartan -hydrochlorothiazide (HYZAAR) 100-25 MG tablet Take  1 tablet by mouth once daily. 90 tablet 3   Melatonin 10 MG TABS Take by mouth.     meloxicam  (MOBIC ) 15 MG tablet Take 1 tablet by mouth daily as needed for pain. 90 tablet 1   Multiple Vitamin (MULTIVITAMIN) capsule Take 1 capsule by mouth daily.     pantoprazole  (PROTONIX ) 40 MG tablet Take 1 tablet (40 mg total) by mouth daily. 90 tablet 3   potassium  chloride SA (KLOR-CON  M) 20 MEQ tablet Take 0.5 tablets (10 mEq total) by mouth daily. 90 tablet 3   venlafaxine  XR (EFFEXOR -XR) 150 MG 24 hr capsule Take 1 capsule by mouth once daily with breakfast. 90 capsule 3   No current facility-administered medications on file prior to visit.    BP 130/84 (BP Location: Left Arm, Patient Position: Sitting, Cuff Size: Large)   Pulse 92   Temp 98.5 F (36.9 C) (Oral)   Ht 5' 3.5 (1.613 m)   Wt 174 lb (78.9 kg)   LMP  (LMP Unknown)   SpO2 96%   BMI 30.34 kg/m   Objective:      Physical Exam GENERAL: Alert, cooperative, well developed, no acute distress. HEAD: Normocephalic atraumatic. EYES: Extraocular movements intact BL, pupils round, equal and reactive to light BL, conjunctivae normal BL. No ptosis at present EXTREMITIES: No cyanosis or edema. NEUROLOGICAL: Oriented to person, place and time, no gait abnormalities, moves all extremities without gross motor or sensory deficit.          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 Differential includes myasthenia gravis also I will test for that.  The ptosis is intermittent and not present on my exam today, no other neuromuscular symptoms.  - Ordered antibodies test for myasthenia gravis. - Referred to neurology for evaluation and possible EMG or nerve conduction study to r/o Lambert-Eaton.   Arthritis of hands Chronic arthritis with potential kidney impact from daily meloxicam  and increased ulcer risk.  Recommended pain management with Tylenol and Voltaren gel.  - Reduce meloxicam  to as needed. - Use arthritis Tylenol 650 mg, 2 tablets daily. - Continue Voltaren gel. - Consider physical therapy if pain persists.   Essential hypertension Normotensive this visit, age-appropriate goal less than 140/90, Hyzaar  and amlodipine .  - Continue current antihypertensive regimen. - Sent refill for amlodipine .  At risk for medication side effect Started taking daily low-dose aspirin as primary prevention for ASCVD.,  Counseled patient that only high risk patients in her age group would have significant benefit which outweighs potential bleeding side effect.  Based on lipid panel a few months ago, her CVD risk score is 2.7%, given that is less than 10% she is moderate risk, no smoking, no known diabetes but will rule that out.  Counseled patient controlling risk factors like HLD, HTN and DM for appropriate ways to minimize her risk, through process of shared decision making, she is agreeable to discontinue aspirin at this time.   Screening for diabetes mellitus No prior diabetes screening.  - Ordered A1c test to screen for diabetes.     Return in about 8 weeks (around 09/13/2024) for  Hand arthritis, 03/23/2025 for CPE, fasting labs 1 wk.   Raeli Wiens K Harshaan Whang, MD  07/19/24     Contains text generated by Abridge.

## 2024-07-19 NOTE — Patient Instructions (Signed)
 Thank you for visiting [facility] today! Here's what we talked about: - STOP Aspirin - Call Neurology if you do not hear from them in 2 weeks

## 2024-07-20 ENCOUNTER — Other Ambulatory Visit: Payer: Self-pay

## 2024-07-20 ENCOUNTER — Ambulatory Visit: Payer: Self-pay

## 2024-07-20 DIAGNOSIS — R7303 Prediabetes: Secondary | ICD-10-CM | POA: Insufficient documentation

## 2024-07-20 NOTE — Telephone Encounter (Signed)
Done at [facility] yesterday.

## 2024-07-26 LAB — ACETYLCHOLINE RECEPTOR, BLOCKING: ACHR Blocking Abs: <15 %{inhibition} (ref <15)

## 2024-08-04 ENCOUNTER — Encounter: Payer: Self-pay | Admitting: Neurology

## 2024-08-13 NOTE — Progress Notes (Deleted)
 "  Initial neurology clinic note  Reason for Evaluation: Consultation requested by Bennett Reuben POUR, MD for an opinion regarding ptosis. My final recommendations will be communicated back to the requesting physician by way of shared medical record or letter to requesting physician via [place]  mail.  HPI: This is [name], a 53 y.o. ***-handed female with a medical history of HTN, HLD, anxiety*** who presents to neurology clinic with the chief complaint of ptosis. The patient is accompanied by ***.  *** Ptosis - bilateral, intermittent Improves with activity Present since ~12/2023 No other clear symptoms Not seen on exam with PCP AChR blocking abs neg Referred to neurology due to concern for LEMS  Current MG symptoms: Ptosis: *** Double vision: *** Speech: *** Chewing: *** Swallowing: *** Breathing: *** Arm strength: *** Leg strength: ***  Any autonomic symptoms?  On fioricet?  Any supplements? Hx of botox?    The patient has not*** had similar episodes of symptoms in the past. ***  Muscle bulk loss? *** Muscle pain? ***  Cramps/Twitching? *** Suggestion of myotonia/difficulty relaxing after contraction? ***  Fatigable weakness?*** Does strength improve after brief exercise?***  Able to brush hair/teeth without difficulty? *** Able to button shirts/use zips? *** Clumsiness/dropping grasped objects?*** Can you arise from squatted position easily? *** Able to get out of chair without using arms? *** Able to walk up steps easily? *** Use an assistive device to walk? *** Significant imbalance with walking? *** Falls?*** Any change in urine color, especially after exertion/physical activity? ***  The patient denies*** symptoms suggestive of oculobulbar weakness including diplopia, ptosis, dysphagia, poor saliva control, dysarthria/dysphonia, impaired mastication, facial weakness/droop.  There are no*** neuromuscular respiratory weakness symptoms, particularly  orthopnea>dyspnea.   Pseudobulbar affect is absent***.  The patient does not*** report symptoms referable to autonomic dysfunction including impaired sweating, heat or cold intolerance, excessive mucosal dryness, gastroparetic early satiety, postprandial abdominal bloating, constipation, bowel or bladder dyscontrol, erectile dysfunction*** or syncope/presyncope/orthostatic intolerance.  There are no*** complaints relating to other symptoms of small fiber modalities including paresthesia/pain.  The patient has not *** noticed any recent skin rashes nor does he*** report any constitutional symptoms like fever, night sweats, anorexia or unintentional weight loss.  EtOH use: ***  Restrictive diet? *** Family history of neuropathy/myopathy/NM disease?***  Previous labs, electrodiagnostics, and neuroimaging are summarized below, but pertinent findings include***  Any biopsy done? *** Current medications being tried for the patient's symptoms include ***  Prior medications that have been tried: ***   MEDICATIONS:  Outpatient Encounter Medications as of 08/27/2024  Medication Sig   albuterol  (VENTOLIN  HFA) 108 (90 Base) MCG/ACT inhaler Inhale 2 puffs into the lungs every 6 (six) hours as needed for wheezing or shortness of breath.   amLODipine  (NORVASC ) 5 MG tablet Take 1 tablet (5 mg total) by mouth daily.   butalbital -acetaminophen-caffeine  (FIORICET) 50-325-40 MG tablet Take 1 tablet by mouth daily as needed for headache.   diclofenac Sodium (VOLTAREN) 1 % GEL Apply 4 g topically 4 (four) times daily.   fexofenadine (ALLEGRA) 180 MG tablet Take 180 mg by mouth daily.   Glycerin-Polysorbate 80 (REFRESH DRY EYE THERAPY OP) Apply to eye.   ipratropium-albuterol  (DUONEB) 0.5-2.5 (3) MG/3ML SOLN Take 3 mLs by nebulization every 6 (six) hours as needed.   losartan -hydrochlorothiazide (HYZAAR) 100-25 MG tablet Take 1 tablet by mouth once daily.   Melatonin 10 MG TABS Take by mouth.   meloxicam   (MOBIC ) 15 MG tablet Take 1 tablet by mouth daily  as needed for pain.   Multiple Vitamin (MULTIVITAMIN) capsule Take 1 capsule by mouth daily.   pantoprazole  (PROTONIX ) 40 MG tablet Take 1 tablet (40 mg total) by mouth daily.   potassium chloride  SA (KLOR-CON  M) 20 MEQ tablet Take 0.5 tablets (10 mEq total) by mouth daily.   venlafaxine  XR (EFFEXOR -XR) 150 MG 24 hr capsule Take 1 capsule by mouth once daily with breakfast.   No facility-administered encounter medications on file as of 08/27/2024.    PAST MEDICAL HISTORY: Past Medical History:  Diagnosis Date   Allergy    Breast fibroadenoma    Diverticulitis    GERD (gastroesophageal reflux disease)    Hypertension    Migraines    Migraines    Premenstrual dysphoric disorder     PAST SURGICAL HISTORY: Past Surgical History:  Procedure Laterality Date   BACK SURGERY  3/07   c6-c7 fused    BREAST BIOPSY Left 2013   negative with clip   BREAST BIOPSY Right 08/11/2018   BX with Dr. Dessa, ribbon marker, FIBROADENOMA WITH FOCAL USUAL DUCT EPITHELIAL HYPERPLASIA   BREAST EXCISIONAL BIOPSY Bilateral yrs ago   benign   CERVICAL FUSION     C6-7   COLONOSCOPY     DILATION AND CURETTAGE OF UTERUS  10/20/14   TONSILLECTOMY     Uterine ablation  5/16   Dr Mearl   wisdom teeth removal   1996    ALLERGIES: Allergies[1]  FAMILY HISTORY: Family History  Problem Relation Age of Onset   Colon polyps Mother    Atrial fibrillation Mother    Congestive Heart Failure Mother    Coronary artery disease Father    Hypertension Father    Lung cancer Father    Breast cancer Sister 19   Colon cancer Maternal Grandmother    Coronary artery disease Paternal Grandmother    Colon cancer Paternal Grandfather    Colon cancer Maternal Uncle     SOCIAL HISTORY: Social History[2] Social History   Social History Narrative   Divorced 2012--- remarried 2015     OBJECTIVE: PHYSICAL EXAM: There were no vitals taken for this  visit.  General:*** General appearance: Awake and alert. No distress. Cooperative with exam.  Skin: No obvious rash or jaundice. HEENT: Atraumatic. Anicteric. Lungs: Non-labored breathing on room air  Heart: Regular Abdomen: Soft, non tender. Extremities: No edema. No obvious deformity.  Musculoskeletal: No obvious joint swelling. Psych: Affect appropriate.  Neurological: Mental Status: Alert. Speech fluent. No pseudobulbar affect Cranial Nerves: CNII: No RAPD. Visual fields grossly intact. CNIII, IV, VI: PERRL. No nystagmus. EOMI. CN V: Facial sensation intact bilaterally to fine touch. [place] clench strong. Jaw jerk***. CN VII: Facial muscles symmetric and strong. No ptosis at rest or after sustained upgaze***. CN VIII: Hearing grossly intact bilaterally. CN IX: No hypophonia. CN X: Palate elevates symmetrically. CN XI: Full strength shoulder shrug bilaterally. CN XII: Tongue protrusion full and midline. No atrophy or fasciculations. No significant dysarthria*** Motor: Tone is ***. *** fasciculations in *** extremities. *** atrophy. No grip or percussive myotonia.***  Individual muscle group testing (MRC grade out of 5):  Movement     Neck flexion ***    Neck extension ***     Right Left   Shoulder abduction *** ***   Shoulder adduction *** ***   Shoulder ext rotation *** ***   Shoulder int rotation *** ***   Elbow flexion *** ***   Elbow extension *** ***   Wrist extension *** ***  Wrist flexion *** ***   Finger abduction - FDI *** ***   Finger abduction - ADM *** ***   Finger extension *** ***   Finger distal flexion - 2/3 *** ***   Finger distal flexion - 4/5 *** ***   Thumb flexion - FPL *** ***   Thumb abduction - APB *** ***    Hip flexion *** ***   Hip extension *** ***   Hip adduction *** ***   Hip abduction *** ***   Knee extension *** ***   Knee flexion *** ***   Dorsiflexion *** ***   Plantarflexion *** ***   Inversion *** ***   Eversion  *** ***   Great toe extension *** ***   Great toe flexion *** ***     Reflexes:  Right Left   Bicep *** ***   Tricep *** ***   BrRad *** ***   Knee *** ***   Ankle *** ***    Pathological Reflexes: Babinski: *** response bilaterally*** Hoffman: *** Troemner: *** Pectoral: *** Palmomental: *** Facial: *** Midline tap: *** Sensation: Pinprick: *** Vibration: *** Temperature: *** Proprioception: *** Coordination: Intact finger-to- nose-finger bilaterally. Romberg negative.*** Gait: Able to rise from chair with arms crossed unassisted. Normal, narrow-based gait. Able to tandem walk. Able to walk on toes and heels.***  Lab and Test Review: Internal labs: 07/19/24: AChR blocking abs neg HbA1c: 5.8  03/23/24: TSH wnl B12: 583 CBC unremarkable CMP unremarkable Lipid panel: tChol 211, LDL 123, TG 138  External labs: ***  Imaging/Procedures:  MRI cervical spine wo contrast (02/04/2009): IMPRESSION:  Central and leftward disc extrusion at C6-7, compressing the left  C7 nerve root causing mild cord flattening.    Mild bulge C5-6.   Lumbar spine xray (02/24/2019): FINDINGS: Mild leftward scoliosis in the lower lumbar spine. Diffuse degenerative facet disease, most pronounced from L3-4 through L5-S1. Disc spaces are maintained. 4 mm of anterolisthesis of L4 on L5. No fracture or subluxation. SI joints symmetric and unremarkable.   IMPRESSION: Degenerative facet disease in the mid and lower lumbar spine. 4 mm anterolisthesis at L4-5. No acute bony abnormality.  ***  ASSESSMENT: [name] is a 53 y.o. female who presents for evaluation of ***. *** has a relevant medical history of ***. *** neurological examination is pertinent for ***. Available diagnostic data is significant for ***. This constellation of symptoms and objective data would most likely localize to ***. ***  PLAN: -Blood work: *** ***  -Return to clinic ***  The impression above as well as the  plan as outlined below were extensively discussed with the patient (in the company of ***) who voiced understanding. All questions were answered to their satisfaction.  The patient was counseled on pertinent fall precautions per the printed material provided today, and as noted under the Patient Instructions section below.***  When available, results of the above investigations and possible further recommendations will be communicated to the patient via telephone/MyChart. Patient to call office if not contacted after expected testing turnaround time.   Total time spent reviewing records, interview, history/exam, documentation, and coordination of care on day of encounter:  *** min   Thank you for allowing me to participate in patient's care.  If I can answer any additional questions, I would be pleased to do so.  Venetia Potters, MD   CC: Bennett Reuben POUR, MD [address] [place] [address]  CC: Referring provider: Bennett Reuben POUR, MD [address]  [place],  [place] [address]    [1]  Allergies Allergen Reactions   Codeine Sulfate     REACTION: Nausea  [2]  Social History Tobacco Use   Smoking status: Never    Passive exposure: Past (as a child)   Smokeless tobacco: Never  Substance Use Topics   Alcohol use: No    Alcohol/week: 0.0 standard drinks of alcohol   Drug use: No   "

## 2024-08-27 ENCOUNTER — Ambulatory Visit: Admitting: Neurology

## 2024-09-06 ENCOUNTER — Telehealth: Payer: Self-pay

## 2024-09-06 NOTE — Telephone Encounter (Signed)
 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 I would think a virtual would be ok, but I will forward to Dr Bennett.

## 2024-09-06 NOTE — Telephone Encounter (Signed)
 Copied from CRM [id number]. Topic: General - Other >> Sep 06, 2024  9:53 AM Alfonso HERO wrote: Reason for CRM: patient is caring for her mother who is on Hospice and needing to change her appt on 1/19 to vitrual because she can't leave her. I called and asked the front office to do it and they told me to send a CRM.

## 2024-09-08 NOTE — Telephone Encounter (Signed)
"  Okay to switch to virtual    "

## 2024-09-10 NOTE — Telephone Encounter (Signed)
 Spoke to pt. Appt will be changed. She may or may not be able to make it. Her mother did pass and they are planning her funeral.

## 2024-09-13 ENCOUNTER — Ambulatory Visit

## 2024-10-07 ENCOUNTER — Ambulatory Visit

## 2024-12-02 ENCOUNTER — Ambulatory Visit: Admitting: Neurology

## 2025-01-10 ENCOUNTER — Other Ambulatory Visit

## 2025-03-17 ENCOUNTER — Other Ambulatory Visit

## 2025-03-24 ENCOUNTER — Encounter

## 2025-03-25 ENCOUNTER — Encounter
